# Patient Record
Sex: Female | Born: 1935 | Race: Black or African American | Hispanic: No
Health system: Southern US, Community
[De-identification: ages and names within clinical notes are randomized; demographics above are authoritative.]

## PROBLEM LIST (undated history)

## (undated) DIAGNOSIS — I2510 Atherosclerotic heart disease of native coronary artery without angina pectoris: Secondary | ICD-10-CM

## (undated) DIAGNOSIS — I10 Essential (primary) hypertension: Secondary | ICD-10-CM

## (undated) DIAGNOSIS — H409 Unspecified glaucoma: Secondary | ICD-10-CM

## (undated) DIAGNOSIS — N189 Chronic kidney disease, unspecified: Secondary | ICD-10-CM

## (undated) DIAGNOSIS — Z955 Presence of coronary angioplasty implant and graft: Secondary | ICD-10-CM

---

## 2016-07-01 ENCOUNTER — Emergency Department (HOSPITAL_COMMUNITY): Payer: Medicare PPO

## 2016-07-01 ENCOUNTER — Inpatient Hospital Stay (HOSPITAL_COMMUNITY)
Admission: EM | Admit: 2016-07-01 | Discharge: 2016-07-07 | DRG: 281 | Disposition: A | Discharge status: Skilled Nursing Facility | Payer: Medicare PPO | Attending: Infectious Diseases | Admitting: Infectious Diseases

## 2016-07-01 ENCOUNTER — Encounter (HOSPITAL_COMMUNITY): Payer: Self-pay | Admitting: Emergency Medicine

## 2016-07-01 DIAGNOSIS — Z87891 Personal history of nicotine dependence: Secondary | ICD-10-CM

## 2016-07-01 DIAGNOSIS — N183 Chronic kidney disease, stage 3 (moderate): Secondary | ICD-10-CM

## 2016-07-01 DIAGNOSIS — Z515 Encounter for palliative care: Secondary | ICD-10-CM

## 2016-07-01 DIAGNOSIS — Z79899 Other long term (current) drug therapy: Secondary | ICD-10-CM

## 2016-07-01 DIAGNOSIS — Z634 Disappearance and death of family member: Secondary | ICD-10-CM

## 2016-07-01 DIAGNOSIS — R627 Adult failure to thrive: Secondary | ICD-10-CM | POA: Diagnosis present

## 2016-07-01 DIAGNOSIS — R55 Syncope and collapse: Secondary | ICD-10-CM | POA: Diagnosis present

## 2016-07-01 DIAGNOSIS — Z87440 Personal history of urinary (tract) infections: Secondary | ICD-10-CM

## 2016-07-01 DIAGNOSIS — H409 Unspecified glaucoma: Secondary | ICD-10-CM | POA: Diagnosis present

## 2016-07-01 DIAGNOSIS — R64 Cachexia: Secondary | ICD-10-CM | POA: Diagnosis present

## 2016-07-01 DIAGNOSIS — M6281 Muscle weakness (generalized): Secondary | ICD-10-CM

## 2016-07-01 DIAGNOSIS — Z7902 Long term (current) use of antithrombotics/antiplatelets: Secondary | ICD-10-CM

## 2016-07-01 DIAGNOSIS — I5022 Chronic systolic (congestive) heart failure: Secondary | ICD-10-CM | POA: Diagnosis present

## 2016-07-01 DIAGNOSIS — I10 Essential (primary) hypertension: Secondary | ICD-10-CM

## 2016-07-01 DIAGNOSIS — I714 Abdominal aortic aneurysm, without rupture: Secondary | ICD-10-CM | POA: Diagnosis present

## 2016-07-01 DIAGNOSIS — I472 Ventricular tachycardia: Secondary | ICD-10-CM | POA: Diagnosis present

## 2016-07-01 DIAGNOSIS — I214 Non-ST elevation (NSTEMI) myocardial infarction: Secondary | ICD-10-CM

## 2016-07-01 DIAGNOSIS — E440 Moderate protein-calorie malnutrition: Secondary | ICD-10-CM | POA: Diagnosis present

## 2016-07-01 DIAGNOSIS — Z950 Presence of cardiac pacemaker: Secondary | ICD-10-CM

## 2016-07-01 DIAGNOSIS — E876 Hypokalemia: Secondary | ICD-10-CM | POA: Diagnosis present

## 2016-07-01 DIAGNOSIS — J811 Chronic pulmonary edema: Secondary | ICD-10-CM

## 2016-07-01 DIAGNOSIS — I129 Hypertensive chronic kidney disease with stage 1 through stage 4 chronic kidney disease, or unspecified chronic kidney disease: Secondary | ICD-10-CM

## 2016-07-01 DIAGNOSIS — I2510 Atherosclerotic heart disease of native coronary artery without angina pectoris: Secondary | ICD-10-CM | POA: Diagnosis present

## 2016-07-01 DIAGNOSIS — Z955 Presence of coronary angioplasty implant and graft: Secondary | ICD-10-CM

## 2016-07-01 DIAGNOSIS — I272 Pulmonary hypertension, unspecified: Secondary | ICD-10-CM | POA: Diagnosis present

## 2016-07-01 DIAGNOSIS — E872 Acidosis: Secondary | ICD-10-CM

## 2016-07-01 DIAGNOSIS — N390 Urinary tract infection, site not specified: Secondary | ICD-10-CM | POA: Diagnosis present

## 2016-07-01 DIAGNOSIS — I447 Left bundle-branch block, unspecified: Secondary | ICD-10-CM | POA: Diagnosis present

## 2016-07-01 DIAGNOSIS — N184 Chronic kidney disease, stage 4 (severe): Secondary | ICD-10-CM | POA: Diagnosis present

## 2016-07-01 DIAGNOSIS — R7989 Other specified abnormal findings of blood chemistry: Secondary | ICD-10-CM

## 2016-07-01 DIAGNOSIS — E861 Hypovolemia: Secondary | ICD-10-CM | POA: Diagnosis present

## 2016-07-01 DIAGNOSIS — E785 Hyperlipidemia, unspecified: Secondary | ICD-10-CM | POA: Diagnosis present

## 2016-07-01 DIAGNOSIS — N179 Acute kidney failure, unspecified: Secondary | ICD-10-CM | POA: Diagnosis not present

## 2016-07-01 DIAGNOSIS — I428 Other cardiomyopathies: Secondary | ICD-10-CM | POA: Diagnosis present

## 2016-07-01 DIAGNOSIS — Z8249 Family history of ischemic heart disease and other diseases of the circulatory system: Secondary | ICD-10-CM

## 2016-07-01 DIAGNOSIS — Z66 Do not resuscitate: Secondary | ICD-10-CM | POA: Diagnosis present

## 2016-07-01 DIAGNOSIS — Z972 Presence of dental prosthetic device (complete) (partial): Secondary | ICD-10-CM

## 2016-07-01 DIAGNOSIS — R778 Other specified abnormalities of plasma proteins: Secondary | ICD-10-CM

## 2016-07-01 DIAGNOSIS — I130 Hypertensive heart and chronic kidney disease with heart failure and stage 1 through stage 4 chronic kidney disease, or unspecified chronic kidney disease: Secondary | ICD-10-CM | POA: Diagnosis present

## 2016-07-01 DIAGNOSIS — F329 Major depressive disorder, single episode, unspecified: Secondary | ICD-10-CM | POA: Diagnosis present

## 2016-07-01 DIAGNOSIS — Z7189 Other specified counseling: Secondary | ICD-10-CM

## 2016-07-01 DIAGNOSIS — I959 Hypotension, unspecified: Secondary | ICD-10-CM | POA: Diagnosis present

## 2016-07-01 DIAGNOSIS — N189 Chronic kidney disease, unspecified: Secondary | ICD-10-CM

## 2016-07-01 DIAGNOSIS — R911 Solitary pulmonary nodule: Secondary | ICD-10-CM | POA: Diagnosis present

## 2016-07-01 HISTORY — DX: Chronic kidney disease, unspecified: N18.9

## 2016-07-01 HISTORY — DX: Presence of coronary angioplasty implant and graft: Z95.5

## 2016-07-01 HISTORY — DX: Atherosclerotic heart disease of native coronary artery without angina pectoris: I25.10

## 2016-07-01 HISTORY — DX: Unspecified glaucoma: H40.9

## 2016-07-01 HISTORY — DX: Essential (primary) hypertension: I10

## 2016-07-01 LAB — COMPREHENSIVE METABOLIC PANEL
ALBUMIN: 3.1 g/dL — AB (ref 3.5–5.0)
ALT: 21 U/L (ref 14–54)
ANION GAP: 14 (ref 5–15)
AST: 35 U/L (ref 15–41)
Alkaline Phosphatase: 67 U/L (ref 38–126)
BUN: 45 mg/dL — AB (ref 6–20)
CHLORIDE: 105 mmol/L (ref 101–111)
CO2: 20 mmol/L — AB (ref 22–32)
Calcium: 9.3 mg/dL (ref 8.9–10.3)
Creatinine, Ser: 1.77 mg/dL — ABNORMAL HIGH (ref 0.44–1.00)
GFR calc Af Amer: 29 mL/min — ABNORMAL LOW (ref >60)
GFR calc non Af Amer: 25 mL/min — ABNORMAL LOW (ref >60)
GLUCOSE: 112 mg/dL — AB (ref 65–99)
POTASSIUM: 3.4 mmol/L — AB (ref 3.5–5.1)
SODIUM: 139 mmol/L (ref 135–145)
Total Bilirubin: 0.5 mg/dL (ref 0.3–1.2)
Total Protein: 6.6 g/dL (ref 6.5–8.1)

## 2016-07-01 LAB — I-STAT TROPONIN, ED: Troponin i, poc: 0.06 ng/mL (ref 0.00–0.08)

## 2016-07-01 LAB — URINALYSIS, ROUTINE W REFLEX MICROSCOPIC
Bilirubin Urine: NEGATIVE
GLUCOSE, UA: NEGATIVE mg/dL
HGB URINE DIPSTICK: NEGATIVE
Ketones, ur: NEGATIVE mg/dL
LEUKOCYTES UA: NEGATIVE
Nitrite: NEGATIVE
PH: 5 (ref 5.0–8.0)
PROTEIN: NEGATIVE mg/dL
SPECIFIC GRAVITY, URINE: 1.02 (ref 1.005–1.030)

## 2016-07-01 LAB — CBC WITH DIFFERENTIAL/PLATELET
BASOS ABS: 0.1 10*3/uL (ref 0.0–0.1)
BASOS PCT: 1 %
EOS ABS: 0.1 10*3/uL (ref 0.0–0.7)
Eosinophils Relative: 1 %
HEMATOCRIT: 35.7 % — AB (ref 36.0–46.0)
Hemoglobin: 12 g/dL (ref 12.0–15.0)
Lymphocytes Relative: 27 %
Lymphs Abs: 2.3 10*3/uL (ref 0.7–4.0)
MCH: 30.3 pg (ref 26.0–34.0)
MCHC: 33.6 g/dL (ref 30.0–36.0)
MCV: 90.2 fL (ref 78.0–100.0)
MONO ABS: 0.6 10*3/uL (ref 0.1–1.0)
MONOS PCT: 7 %
NEUTROS ABS: 5.5 10*3/uL (ref 1.7–7.7)
NEUTROS PCT: 64 %
Platelets: 192 10*3/uL (ref 150–400)
RBC: 3.96 MIL/uL (ref 3.87–5.11)
RDW: 14.4 % (ref 11.5–15.5)
WBC: 8.6 10*3/uL (ref 4.0–10.5)

## 2016-07-01 LAB — MAGNESIUM: Magnesium: 1.5 mg/dL — ABNORMAL LOW (ref 1.7–2.4)

## 2016-07-01 LAB — TROPONIN I
TROPONIN I: 0.06 ng/mL — AB (ref <0.03)
TROPONIN I: 0.6 ng/mL — AB (ref <0.03)
TROPONIN I: 3.75 ng/mL — AB (ref <0.03)

## 2016-07-01 MED ORDER — ACETAMINOPHEN 650 MG RE SUPP: 650.0000 mg | Freq: Four times a day (QID) | RECTAL | Status: DC | PRN

## 2016-07-01 MED ORDER — PRAVASTATIN SODIUM 40 MG PO TABS
40.0000 mg | ORAL_TABLET | Freq: Every day | ORAL | Status: DC
  Administered 2016-07-02 – 2016-07-06 (×5): 40 mg via ORAL
  Filled 2016-07-01 (×6): qty 1

## 2016-07-01 MED ORDER — SODIUM CHLORIDE 0.9 % IV BOLUS (SEPSIS)
1000.0000 mL | Freq: Once | INTRAVENOUS | Status: AC
  Administered 2016-07-01: 1000 mL via INTRAVENOUS

## 2016-07-01 MED ORDER — SODIUM CHLORIDE 0.9 % IV BOLUS (SEPSIS)
500.0000 mL | Freq: Once | INTRAVENOUS | Status: AC
  Administered 2016-07-01: 500 mL via INTRAVENOUS

## 2016-07-01 MED ORDER — ENOXAPARIN SODIUM 30 MG/0.3ML ~~LOC~~ SOLN
30.0000 mg | SUBCUTANEOUS | Status: DC
  Administered 2016-07-01: 30 mg via SUBCUTANEOUS
  Filled 2016-07-01: qty 0.3

## 2016-07-01 MED ORDER — CLOPIDOGREL BISULFATE 75 MG PO TABS: 75.0000 mg | ORAL_TABLET | Freq: Every day | ORAL | Status: DC

## 2016-07-01 MED ORDER — ASPIRIN 325 MG PO TABS
325.0000 mg | ORAL_TABLET | Freq: Every day | ORAL | Status: DC
  Administered 2016-07-01 – 2016-07-02 (×2): 325 mg via ORAL
  Filled 2016-07-01 (×2): qty 1

## 2016-07-01 MED ORDER — POLYETHYLENE GLYCOL 3350 17 G PO PACK: 17.0000 g | PACK | Freq: Every day | ORAL | Status: DC | PRN

## 2016-07-01 MED ORDER — MAGNESIUM SULFATE 2 GM/50ML IV SOLN
2.0000 g | Freq: Once | INTRAVENOUS | Status: AC
  Administered 2016-07-01: 2 g via INTRAVENOUS
  Filled 2016-07-01: qty 50

## 2016-07-01 MED ORDER — SODIUM CHLORIDE 0.9 % IV SOLN
INTRAVENOUS | Status: AC
  Administered 2016-07-01 (×2): via INTRAVENOUS

## 2016-07-01 MED ORDER — ENSURE ENLIVE PO LIQD
237.0000 mL | Freq: Two times a day (BID) | ORAL | Status: DC
  Administered 2016-07-02 – 2016-07-07 (×9): 237 mL via ORAL

## 2016-07-01 MED ORDER — SODIUM CHLORIDE 0.9% FLUSH
3.0000 mL | Freq: Two times a day (BID) | INTRAVENOUS | Status: DC
  Administered 2016-07-01 – 2016-07-05 (×5): 3 mL via INTRAVENOUS

## 2016-07-01 MED ORDER — POTASSIUM CHLORIDE 20 MEQ/15ML (10%) PO SOLN
20.0000 meq | Freq: Once | ORAL | Status: AC
  Administered 2016-07-01: 20 meq via ORAL
  Filled 2016-07-01: qty 15

## 2016-07-01 MED ORDER — ACETAMINOPHEN 325 MG PO TABS
650.0000 mg | ORAL_TABLET | Freq: Four times a day (QID) | ORAL | Status: DC | PRN
  Administered 2016-07-06 – 2016-07-07 (×2): 650 mg via ORAL
  Filled 2016-07-01 (×2): qty 2

## 2016-07-01 MED ORDER — POTASSIUM CHLORIDE CRYS ER 20 MEQ PO TBCR
20.0000 meq | EXTENDED_RELEASE_TABLET | Freq: Once | ORAL | Status: DC
  Filled 2016-07-01: qty 1
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
## 2016-07-01 NOTE — ED Notes (Signed)
Patient transporting to radiology for xray with family member and transporter. Pt resting.

## 2016-07-01 NOTE — ED Notes (Signed)
Pt sleeping. Rise and fall of chest noted

## 2016-07-01 NOTE — ED Triage Notes (Signed)
Pt to ER BIB [facility] from home where patient lives with family. Pt here for evaluation of syncopal episode while using the restroom this morning. No fall, family lowered patient to the floor. EMS was activated. On arrival EMS reported hypotension with diaphoresis. On arrival, patient hypotension resolved at 113/59, HR 70, pt resting at this time. 20 G to Right wrist. Currently being treated for UTI per EMS.

## 2016-07-01 NOTE — Discharge Summary (Signed)
Name: Lindsay Rose MRN: [id number] DOB: [birth year] 82 y.o. PCP: No primary care provider on file.  Date of Admission: 07/01/2016  7:02 AM Date of Discharge: 07/07/2016 Attending Physician: Ginnie Smart, MD  Discharge Diagnosis: 1. NSEMI 2. HFrEF 3. Depression 4. Malnutrition  Principal Problem:   NSTEMI (non-ST elevated myocardial infarction) (HCC) Active Problems:   Syncope   Acute kidney injury (HCC)   Malnutrition of moderate degree   Essential hypertension, benign   Elevated troponin   Positive D dimer   Syncope and collapse   Pulmonary HTN   Muscle weakness (generalized)   Goals of care, counseling/discussion   Palliative care encounter   Discharge Medications:   Medication List    STOP taking these medications   levofloxacin 250 MG tablet Commonly known as:  LEVAQUIN     TAKE these medications   amLODipine 10 MG tablet Commonly known as:  NORVASC Take 10 mg by mouth daily.   aspirin 81 MG EC tablet Take 1 tablet (81 mg total) by mouth daily. Start taking on:  07/08/2016   carvedilol 3.125 MG tablet Commonly known as:  COREG Take 1 tablet (3.125 mg total) by mouth 2 (two) times daily with a meal.   citalopram 20 MG tablet Commonly known as:  CELEXA Take 1 tablet (20 mg total) by mouth daily. Start taking on:  07/08/2016   clopidogrel 75 MG tablet Commonly known as:  PLAVIX Take 75 mg by mouth daily.   COMBIGAN 0.2-0.5 % ophthalmic solution Generic drug:  brimonidine-timolol Place 1 drop into both eyes 2 (two) times daily.   feeding supplement (ENSURE ENLIVE) Liqd Take 237 mLs by mouth 2 (two) times daily between meals.   hydrochlorothiazide 25 MG tablet Commonly known as:  HYDRODIURIL Take 25 mg by mouth daily.   isosorbide mononitrate 30 MG 24 hr tablet Commonly known as:  IMDUR Take 0.5 tablets (15 mg total) by mouth daily. Start taking on:  07/08/2016   losartan 25 MG tablet Commonly known as:  COZAAR Take 1 tablet  (25 mg total) by mouth at bedtime.   metoprolol succinate 100 MG 24 hr tablet Commonly known as:  TOPROL-XL Take 100 mg by mouth daily. Take with or immediately following a meal.   mirtazapine 15 MG disintegrating tablet Commonly known as:  REMERON SOL-TAB Take 0.5 tablets (7.5 mg total) by mouth at bedtime.   pravastatin 40 MG tablet Commonly known as:  PRAVACHOL Take 40 mg by mouth daily.   promethazine 25 MG tablet Commonly known as:  PHENERGAN Take 25 mg by mouth every 6 (six) hours as needed for nausea or vomiting.   spironolactone 25 MG tablet Commonly known as:  ALDACTONE Take 0.5 tablets (12.5 mg total) by mouth daily. Start taking on:  07/08/2016   terazosin 1 MG capsule Commonly known as:  HYTRIN Take 1 mg by mouth at bedtime.       Disposition and follow-up:   Lindsay Rose was discharged from [facility] in [place] condition.  At the hospital follow up visit please address:  1.  CHF.  Assess volume status and diuretic regimen.  2.  HTN.  Manage antihypertensives  3.  Depression.  Depression is major contributor to her malnourishment and failure to thrive.  Started citalopram and mirtazapine while admitted.  4. Pulmonary Nodule.  Incidentally found on CT, RUL nodule, consider repeat CT in 1 year.  5.  AAA.  Incidentally found on CT, consider ultrasound in 3 years.  6.  Labs / imaging needed at time of follow-up: CBC, BMP  7.  Pending labs/ test needing follow-up: none  Follow-up Appointments: Follow-up Information    Robbie Lis, PA-C Follow up on 07/22/2016.   Specialties:  Cardiology, Radiology Why:  at 3:00pm for hospital follow up.  Contact information: [address] [place] [address] [phone]        [facility]. Go in 6 day(s).   Why:  at 3:15 Contact information: [address] [place] [address] [phone]       Primary Care. Schedule an  appointment as soon as possible for a visit in 2 week(s).        Cardiology. Schedule an appointment as soon as possible for a visit in 1 month(s).           Hospital Course by problem list: Principal Problem:   NSTEMI (non-ST elevated myocardial infarction) (HCC) Active Problems:   Syncope   Acute kidney injury (HCC)   Malnutrition of moderate degree   Essential hypertension, benign   Elevated troponin   Positive D dimer   Syncope and collapse   Pulmonary HTN   Muscle weakness (generalized)   Goals of care, counseling/discussion   Palliative care encounter   1. Syncope Lindsay Rose presented following 2 episodes of syncope associated reported to be associated with micturation, though she was unable to recall the circumstances surrounding her loss of consciousness.  She was orthostatic on presentation and was found to have elevated troponins.  To investigate possible etiologies of syncope her syncope, PE was ruled out with CTA, her pacemaker was interrogated and did not show any shocks or arrythmias to explain her loss of consciousness.  She was given IVF and her orthostasis improved.  She had no further episodes of syncope.  Ultimately, vasovagal syncope or orthostasis were deemed to be most likely cause of her syncope.  2. NSTEMI As part of the investigation of her syncope, ACS was probed with troponins and EKG.  She had no clear signs of ischemia on her V-paced EKG, but Troponins were elevated with peak of 4.14 the night of admission and began falling.  She never reported symptoms of chest pain or dyspnea.  She was therapeutically anticoagulated with heparin Cardiac catheterization performed on 11/14 showed 70% stenosis of RCA, was not intervened upon.  -Dual antiplatelet therapy with aspirin and plavix  3. HTN 4. CHF Echocardigram showed reduced EF of 20-25%. With unclear medication compliance prior to admission, she was started on carvedilol 3.125 mg BID, losartan 25 mg daily,  Imdur 15 mg daily, and spironolactone 12.5 mg daily.  At admission she was hypovolemic and orthostatic, and was gently fluid repleted with normal saline and became normotensive.  5. Depression Endorses profound depression leading to apathy, malnourishment, and the psychomotor retardation present at admission. Reports successful treatment with antidepressants in the past, and is interested in resuming treatment.  During her admission, she had days of profoundly depressed and apathetic mood and affect and at teams when her niece was visting she had normal mood and affect.  She was started on citalopram 20 mg daily and mirtazapine 7.5 mg at night.  6. Acute on Chronic Renal Insufficiency Elevated creatinine, BUN/Cr, positive orthostatic vitals and clinically volume depleted on admission consistent with prerenal azotemia. Endorses very poor PO intake due to depression and anorexia prior to admission. Returned to baseline with IV hydration and improved PO intake.  7. Concern for GI Bleed History  on admission noted one possible maroon-colored stool on the day of admission, but she seems to not be a reliable historian. No melenic stools or hematochezzia, FOBT negative, no clinical evidence of GI bleed.  Discharge Vitals:   BP (!) 143/75 (BP Location: Left Arm)   Pulse 81   Temp 98.2 F (36.8 C) (Oral)   Resp 18   Wt 97 lb (44 kg)   SpO2 100%   Pertinent Labs, Studies, and Procedures:   CBC Latest Ref Rng & Units 07/07/2016 07/06/2016 07/05/2016  WBC 4.0 - 10.5 K/uL 8.9 11.3(H) 11.8(H)  Hemoglobin 12.0 - 15.0 g/dL 1.6(X) 0.9(U) 10.3(L)  Hematocrit 36.0 - 46.0 % 25.8(L) 27.6(L) 31.3(L)  Platelets 150 - 400 K/uL 268 251 252   CMP Latest Ref Rng & Units 07/07/2016 07/06/2016 07/05/2016  Glucose 65 - 99 mg/dL 67 78 84  BUN 6 - 20 mg/dL 10 12 12   Creatinine 0.44 - 1.00 mg/dL 0.45(W) 0.98 1.19  Sodium 135 - 145 mmol/L 140 141 142  Potassium 3.5 - 5.1 mmol/L 3.5 3.6 3.6  Chloride 101 - 111  mmol/L 107 110 110  CO2 22 - 32 mmol/L 21(L) 23 24  Calcium 8.9 - 10.3 mg/dL 1.4(N) 8.2(N) 5.6(O)  Total Protein 6.5 - 8.1 g/dL - - -  Total Bilirubin 0.3 - 1.2 mg/dL - - -  Alkaline Phos 38 - 126 U/L - - -  AST 15 - 41 U/L - - -  ALT 14 - 54 U/L - - -   CTA Chest PE 07/05/2016 IMPRESSION: 1. Technically adequate exam showing no acute pulmonary embolus. 2. Cardiomegaly and coronary artery disease.  Transvenous pacemaker. 3. Emphysematous changes. 4. Right upper lobe nodule. No follow-up needed if patient is low-risk. Non-contrast chest CT can be considered in 12 months if patient is high-risk. This recommendation follows the consensus statement: Guidelines for Management of Incidental Pulmonary Nodules Detected on CT Images: From the Fleischner Society 2017; Radiology 2017; 284:228-243. 5. Abdominal aortic aneurysm, 3.3 cm maximum diameter. Recommend followup by [place] in 3 years. This recommendation follows ACR consensus guidelines: White Paper of the ACR Incidental Findings Committee II on Vascular Findings. J Am Coll Radiol 2013; 13:086-578 6. Colonic diverticulosis.  Echocardiogram 07/02/2016 EF 30-35% G1DD Pulmonary arteries: Systolic pressure was severely increased. PA peak pressure: 67 mm Hg (S). Inferior vena cava: The vessel was dilated. The respirophasic diameter changes were blunted (< 50%), consistent with elevated central venous pressure. Right ventricle: The cavity size was normal. Wall thickness was normal. Systolic function was normal.  Right and Left Heart Catheterization and Coronary Angiography 07/06/2016  LV end diastolic pressure is normal.  Mid RCA lesion, 70 %stenosed.  Ost LAD to Prox LAD lesion, 20 %stenosed.  Ost Cx to Prox Cx lesion, 30 %stenosed.  LV end diastolic pressure is normal.  Normal right heart pressures.   1. Single vessel borderline obstructive CAD 2. Normal LVEDP 3. Normal right heart pressures. 4. Normal cardiac output.   Plan:  medical management.   Discharge Instructions: Discharge Instructions    Diet - low sodium heart healthy    Complete by:  As directed    Increase activity slowly    Complete by:  As directed      You were admitted to the hospital because you passed out, and we found that you were having a heart attack.  We treated you for the heart attack and adjusted your heart medications.  You have also been suffering from severe depression which has  contributed to you becoming weak and malnourished.  I have prescribed you 2 new medicine for depression, citalopram and mitrazapine.  It is important to take them every day regardless of how you are feeling.  Please review your medication list and take everything as prescribed.  It is important for you to follow-up with a primary doctor next week and a cardiologist within a month.  We have made appointments for [place] in [place].  [facility], please establish care with a doctor ASAP.  Signed: Alm BustardMatthew O'Sullivan, MD 07/07/2016, 1:35 PM   Pager: [phone]

## 2016-07-01 NOTE — ED Notes (Signed)
Attempted to call report to 2W 

## 2016-07-01 NOTE — ED Notes (Signed)
Pt medical records from [facility] obtained and given to MD Liu.

## 2016-07-01 NOTE — ED Notes (Signed)
Secretary Cassie asked to obtain medical records from patient hospital visit last week.

## 2016-07-01 NOTE — ED Notes (Addendum)
CRITICAL VALUE ALERT  Critical value received:  Troponin 0.06  Date of notification:  07/01/2016  Time of notification:  0924  Critical value read back: yes  Nurse who received alert:  Fredirick Maudlin  MD notified: MD Verdie Mosher
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
## 2016-07-01 NOTE — Consult Note (Signed)
Cardiology Consult    Patient ID: Lindsay Rose MRN: [id number], DOB/AGE: May 17, 1928   Admit date: 07/01/2016 Date of Consult: 07/01/2016  Primary Physician: No primary care provider on file. Reason for Consult: Troponin Elevation Primary Cardiologist: None Requesting Provider: Dr. Georgetta Haber   History of Present Illness    [name] is an 82 year old female with a significant past medical history of stage III chronic kidney disease, essential hypertension, and pacemaker for unclear etiology at this time is here for syncope and concern for bloody stools.  [name] was admitted earlier today after fainting twice at home.  Patient states that she did faint at home today but is unable to tell me what exactly happened.  She states she does not remember her symptoms before or after fainting, but does state she fainted.  Patient states that she has been doing well until today.  At this time, she does not report chest pain or shortness of breath.  Patient says that this is the first time she ever fainted.  She denies ever feeling lightheaded or dizzy.  Patient is without any other cardiac related complaints such as swelling in her legs, shortness of breath when laying flat, or waking up in the middle of the night due to shortness of breath.  She does not recall why she has a defibrillator but states it was placed because her heart was weak.  She does not report having a heart attack prior.    Past Medical History   Past Medical History:  Diagnosis Date  . CKD (chronic kidney disease) 07/01/2016  . Essential hypertension 07/01/2016  . Glaucoma 07/01/2016  . Presence of stent in coronary artery in patient with coronary artery disease 07/01/2016    History reviewed. No pertinent surgical history.   Allergies  Not on File  Inpatient Medications    . aspirin  325 mg Oral Daily  . enoxaparin (LOVENOX) injection  30 mg Subcutaneous Q24H  . [START ON 07/02/2016]  feeding supplement (ENSURE ENLIVE)  237 mL Oral BID BM  . pravastatin  40 mg Oral q1800  . sodium chloride flush  3 mL Intravenous Q12H    Family History    History reviewed. No pertinent family history.  Social History    Social History   Social History  . Marital status: Widowed    Spouse name: N/A  . Number of children: N/A  . Years of education: N/A   Occupational History  . Not on file.   Social History Main Topics  . Smoking status: Unknown If Ever Smoked  . Smokeless tobacco: Never Used  . Alcohol use No  . Drug use: Unknown  . Sexual activity: Not on file   Other Topics Concern  . Not on file   Social History Narrative  . No narrative on file     Review of Systems    General:  No chills, fever, night sweats or weight changes.  Cardiovascular:  No chest pain, dyspnea on exertion, edema, orthopnea, palpitations, paroxysmal nocturnal dyspnea. Dermatological: No rash, lesions/masses Respiratory: No cough, dyspnea Urologic: No hematuria, dysuria Abdominal:   No nausea, vomiting, diarrhea, bright red blood per rectum, melena, or hematemesis Neurologic:  No visual changes, wkns, changes in mental status. All other systems reviewed and are otherwise negative except as noted above.  Physical Exam    Blood pressure 136/75, pulse 64, temperature 97.9 F (36.6 C), temperature source Oral, resp. rate 20, SpO2 100 %.  General: Pleasant,  NAD Psych: Normal affect. Neuro: Alert and oriented X 3. Moves all extremities spontaneously. HEENT: Normal  Neck: Supple without bruits or JVD. Lungs:  Resp regular and unlabored, CTA. Heart: RRR, frequent ectopy, 1/6 systolic ejection murmur, no s3, s4. Abdomen: Soft, non-tender, non-distended, BS + x 4.  Extremities: No clubbing, cyanosis or edema. DP/PT/Radials 2+ and equal bilaterally.  Labs    Troponin [facility] Test)  Recent Labs  07/01/16 0742  TROPIPOC 0.06    Recent Labs  07/01/16 0735 07/01/16 1309  07/01/16 1914  TROPONINI 0.06* 0.60* 3.75*   Lab Results  Component Value Date   WBC 8.6 07/01/2016   HGB 12.0 07/01/2016   HCT 35.7 (L) 07/01/2016   MCV 90.2 07/01/2016   PLT 192 07/01/2016    Recent Labs Lab 07/01/16 0735  NA 139  K 3.4*  CL 105  CO2 20*  BUN 45*  CREATININE 1.77*  CALCIUM 9.3  PROT 6.6  BILITOT 0.5  ALKPHOS 67  ALT 21  AST 35  GLUCOSE 112*   No results found for: CHOL, HDL, LDLCALC, TRIG No results found for: [facility]   Radiology Studies    Dg Chest 2 View  Result Date: 07/01/2016 CLINICAL DATA:  Generalized weakness for the past 2 weeks, syncope. EXAM: CHEST  2 VIEW COMPARISON:  None in PACs FINDINGS: The lungs are mildly hyperinflated. There is bibasilar density. There is no large pleural effusion. There is no pneumothorax. The heart is mildly enlarged. There is calcification of the mitral annulus. The pulmonary vascularity is normal. The ICD is in reasonable position. There is calcification in the wall of the aortic arch. The observed bony thorax exhibits no acute abnormality. IMPRESSION: COPD. Bibasilar atelectasis or scarring. Cardiomegaly without pulmonary edema. Aortic atherosclerosis. Electronically Signed   By: David  [place] M.D.   On: 07/01/2016 08:04    EKG & Cardiac Imaging    EKG: 07/01/16 at 21:08; normal sinus rhythm with intermittent atrial pacing, ventricular pacing, non-specific t-wave changes  Echocardiogram: none documented  Assessment & Plan    [name] is an 82 year old female with a significant past medical history of stage III chronic kidney disease, essential hypertension, and pacemaker for unclear etiology at this time is here for syncope and concern for bloody stools.  Patient has a rising troponin level concerning for NSTEMI-ACS.  Patient with vague symptoms and syncope that may be due to NSTEMI-ACS, but potential gastrointestinal could be an etiology with her troponin elevation due to a type II myocardial  infarction (demand ischemia).  Low concern for non-coronary related etiologies of her elevated troponin level such as pulmonary embolism or myocarditis.  # NSTEMI-ACS: Type I (acute plaque rupture) versus a type II myocardial infarction in the setting of potential gastrointestinal bleeding. At this time, patient is hemodynamically stable.  Ventricular pacing decreasing the sensitivity of evaluating for myocardial ischemia/infarction, but no obvious concerning changes on ECG at this time.  Her GRACE Score is 169, making her intermediate risk for in-hospital death.  Management of NSTEM-ACS is complicated at this time due to potential gastrointestinal bleeding. - Please confirm patient has blood in her stool.  If she does not, would recommend starting heparin drip for 48 hours. - Will await starting a P2Y12 (i.e. Plavix) at this time while we await stool results. - Please trend troponin level for now x2. - Due to blood pressure being on the lower end of normal and potential for hypovolemic shock, will recommend against starting a  beta-blocker at this time.   - Will need to get echocardiogram in the morning. - Will pursue an ischemia guided strategy at this time and will not recommend a left heart catheterization based on her DNR status, medical co-morbidities, and potential active bleeding.   - Continue aspirin at 81 mg/daily for now.   - Check lipid panel in the morning.    # Syncope  Unclear etiology at this time in the setting of potential gastrointestinal bleeding, dual chamber defibrillator, and NSTEMI-ACS.  Patient appears to be hemodynamically stable at this time.  Currently awaiting device check for further evaluation of potential etiologies for syncope.   - Day team cardiologist will follow up with device check results. - Continue telemetry and management of NSTEMI-ACS as above.   # Essential hypertension: Patient with a chart history of essential hypertension.  It appears that she does have  overt end-organ damage with stage III chronic kidney disease.  Home medications are being held at this time due to syncope and potential hypovolemia. - Agree with holding blood pressure medications at this time.     Signed, Judie GrieveKamal D Alvenia Treese, MD 07/01/2016, 10:08 PM

## 2016-07-01 NOTE — H&P (Signed)
  Date: 07/01/2016  Patient name: Lindsay Rose  Medical record number: [id number]  Date of birth: [birth year]   I have seen and evaluated Lindsay Rose and discussed their care with the Residency Team.  82 yo F with hx of recent treatment for UTI (cefdinir --> levaquin).  She lives with family for last week (prior living with boyfriend) and also has a hx of pacemaker placement for ? reason.  She woke up this AM and was noted to be diaphoretic by her niece. She was helped to the toilet and synopsized. Her BM at that time was noted as initially formed then progressing to runny/burgundy/tarry.  EMS was called and she again had a syncopal episode.   PMHx, Fam Hx, and/or Soc Hx : see H/O's note.  ROS: no CP, SOB, occas cough, no dysuria, no palpitations, no headaches, no room spinning or dizziness, no vision change. Denies falls.   Vitals:   07/01/16 1055 07/01/16 1236  BP: 128/69 128/60  Pulse: 83 73  Resp: 20 19  Temp:  97.7 F (36.5 C)  afebrile Eyes- EOMI Mouth-dry Chest- CTA. L chest pacer is non-tender, no fluctuance.  CV- RRR Abd- BS+, soft, non-tender.  Extr- no edema, no LE lesions.  MSE- Date xx-2017, Place- [place], [place].   President (correct)  Lab: K 3.4 Cr 1.77 Bun 45 Troponin 0.06 -- 0.06 --0.6 CXR: COPD. Bibasilar atelectasis or scarring. Cardiomegaly without pulmonary edema. Aortic atherosclerosis. ECG: ventricular paced rhythm 81 bpm.   Assessment and Plan: I have seen and evaluated the patient as outlined above. I agree with the formulated Assessment and Plan as detailed in the residents' note, with the following changes:   Syncope Will check TTE will f/u her Tele Her Troponin has increased.will have her seen by CV Hydration as she tolerates.  Not clear if med related (beta-blocker) Check stool guaic.   Pacer Will ask CV to interrogate.  The etiology of this is unclear.   ARF? Her baseline is not clear. Cr 1.8 one week prior  Failure to  Thrive Suspect due to multiple psychosocial issues at previous living environment.  Will have nutrition eval.   Ginnie Smart, MD 11/9/20171:50 PM

## 2016-07-02 ENCOUNTER — Observation Stay (HOSPITAL_COMMUNITY): Payer: Medicare PPO

## 2016-07-02 ENCOUNTER — Observation Stay (HOSPITAL_BASED_OUTPATIENT_CLINIC_OR_DEPARTMENT_OTHER): Payer: Medicare PPO

## 2016-07-02 DIAGNOSIS — N184 Chronic kidney disease, stage 4 (severe): Secondary | ICD-10-CM | POA: Diagnosis present

## 2016-07-02 DIAGNOSIS — R748 Abnormal levels of other serum enzymes: Secondary | ICD-10-CM

## 2016-07-02 DIAGNOSIS — N179 Acute kidney failure, unspecified: Secondary | ICD-10-CM | POA: Diagnosis present

## 2016-07-02 DIAGNOSIS — E43 Unspecified severe protein-calorie malnutrition: Secondary | ICD-10-CM

## 2016-07-02 DIAGNOSIS — I272 Pulmonary hypertension, unspecified: Secondary | ICD-10-CM | POA: Diagnosis present

## 2016-07-02 DIAGNOSIS — Z95810 Presence of automatic (implantable) cardiac defibrillator: Secondary | ICD-10-CM | POA: Diagnosis not present

## 2016-07-02 DIAGNOSIS — I428 Other cardiomyopathies: Secondary | ICD-10-CM | POA: Diagnosis present

## 2016-07-02 DIAGNOSIS — I2510 Atherosclerotic heart disease of native coronary artery without angina pectoris: Secondary | ICD-10-CM | POA: Diagnosis not present

## 2016-07-02 DIAGNOSIS — E440 Moderate protein-calorie malnutrition: Secondary | ICD-10-CM | POA: Insufficient documentation

## 2016-07-02 DIAGNOSIS — I5022 Chronic systolic (congestive) heart failure: Secondary | ICD-10-CM | POA: Diagnosis present

## 2016-07-02 DIAGNOSIS — F329 Major depressive disorder, single episode, unspecified: Secondary | ICD-10-CM | POA: Diagnosis present

## 2016-07-02 DIAGNOSIS — R55 Syncope and collapse: Secondary | ICD-10-CM

## 2016-07-02 DIAGNOSIS — I472 Ventricular tachycardia: Secondary | ICD-10-CM | POA: Diagnosis present

## 2016-07-02 DIAGNOSIS — I25118 Atherosclerotic heart disease of native coronary artery with other forms of angina pectoris: Secondary | ICD-10-CM | POA: Diagnosis not present

## 2016-07-02 DIAGNOSIS — I10 Essential (primary) hypertension: Secondary | ICD-10-CM

## 2016-07-02 DIAGNOSIS — R911 Solitary pulmonary nodule: Secondary | ICD-10-CM | POA: Diagnosis present

## 2016-07-02 DIAGNOSIS — R64 Cachexia: Secondary | ICD-10-CM | POA: Diagnosis present

## 2016-07-02 DIAGNOSIS — M6281 Muscle weakness (generalized): Secondary | ICD-10-CM | POA: Diagnosis not present

## 2016-07-02 DIAGNOSIS — I130 Hypertensive heart and chronic kidney disease with heart failure and stage 1 through stage 4 chronic kidney disease, or unspecified chronic kidney disease: Secondary | ICD-10-CM | POA: Diagnosis present

## 2016-07-02 DIAGNOSIS — Z7189 Other specified counseling: Secondary | ICD-10-CM | POA: Diagnosis not present

## 2016-07-02 DIAGNOSIS — E861 Hypovolemia: Secondary | ICD-10-CM | POA: Diagnosis present

## 2016-07-02 DIAGNOSIS — E785 Hyperlipidemia, unspecified: Secondary | ICD-10-CM | POA: Diagnosis present

## 2016-07-02 DIAGNOSIS — E872 Acidosis: Secondary | ICD-10-CM | POA: Diagnosis present

## 2016-07-02 DIAGNOSIS — I129 Hypertensive chronic kidney disease with stage 1 through stage 4 chronic kidney disease, or unspecified chronic kidney disease: Secondary | ICD-10-CM | POA: Diagnosis not present

## 2016-07-02 DIAGNOSIS — I959 Hypotension, unspecified: Secondary | ICD-10-CM | POA: Diagnosis present

## 2016-07-02 DIAGNOSIS — R627 Adult failure to thrive: Secondary | ICD-10-CM | POA: Diagnosis present

## 2016-07-02 DIAGNOSIS — E876 Hypokalemia: Secondary | ICD-10-CM | POA: Diagnosis present

## 2016-07-02 DIAGNOSIS — N390 Urinary tract infection, site not specified: Secondary | ICD-10-CM | POA: Diagnosis present

## 2016-07-02 DIAGNOSIS — I214 Non-ST elevation (NSTEMI) myocardial infarction: Secondary | ICD-10-CM | POA: Insufficient documentation

## 2016-07-02 DIAGNOSIS — I447 Left bundle-branch block, unspecified: Secondary | ICD-10-CM | POA: Diagnosis present

## 2016-07-02 DIAGNOSIS — H409 Unspecified glaucoma: Secondary | ICD-10-CM | POA: Diagnosis present

## 2016-07-02 DIAGNOSIS — N183 Chronic kidney disease, stage 3 (moderate): Secondary | ICD-10-CM | POA: Diagnosis not present

## 2016-07-02 DIAGNOSIS — Z66 Do not resuscitate: Secondary | ICD-10-CM | POA: Diagnosis present

## 2016-07-02 LAB — ECHOCARDIOGRAM COMPLETE
CHL CUP MV DEC (S): 275
CHL CUP RV SYS PRESS: 64 mmHg
CHL CUP TV REG PEAK VELOCITY: 385 cm/s
E/e' ratio: 10.97
EWDT: 275 ms
FS: 17 % — AB (ref 28–44)
IVS/LV PW RATIO, ED: 0.96
LA ID, A-P, ES: 30 mm
LAVOLA4C: 27.8 mL
LEFT ATRIUM END SYS DIAM: 30 mm
LV TDI E'MEDIAL: 3.71
LVEEAVG: 10.97
LVEEMED: 10.97
LVELAT: 5.18 cm/s
LVOT area: 3.8 cm2
LVOT diameter: 22 mm
Lateral S' vel: 10.8 cm/s
MV pk E vel: 56.8 m/s
MVPKAVEL: 111 m/s
PW: 9.89 mm — AB (ref 0.6–1.1)
TAPSE: 26.1 mm
TDI e' lateral: 5.18
TR max vel: 385 cm/s

## 2016-07-02 LAB — CBC
HEMATOCRIT: 31.1 % — AB (ref 36.0–46.0)
Hemoglobin: 10.2 g/dL — ABNORMAL LOW (ref 12.0–15.0)
MCH: 29.4 pg (ref 26.0–34.0)
MCHC: 32.8 g/dL (ref 30.0–36.0)
MCV: 89.6 fL (ref 78.0–100.0)
PLATELETS: 200 10*3/uL (ref 150–400)
RBC: 3.47 MIL/uL — ABNORMAL LOW (ref 3.87–5.11)
RDW: 14.4 % (ref 11.5–15.5)
WBC: 8.6 10*3/uL (ref 4.0–10.5)

## 2016-07-02 LAB — OCCULT BLOOD X 1 CARD TO LAB, STOOL: Fecal Occult Bld: NEGATIVE

## 2016-07-02 LAB — BASIC METABOLIC PANEL
Anion gap: 11 (ref 5–15)
BUN: 33 mg/dL — AB (ref 6–20)
CHLORIDE: 109 mmol/L (ref 101–111)
CO2: 19 mmol/L — ABNORMAL LOW (ref 22–32)
CREATININE: 1.27 mg/dL — AB (ref 0.44–1.00)
Calcium: 8.6 mg/dL — ABNORMAL LOW (ref 8.9–10.3)
GFR calc Af Amer: 43 mL/min — ABNORMAL LOW (ref >60)
GFR calc non Af Amer: 37 mL/min — ABNORMAL LOW (ref >60)
GLUCOSE: 67 mg/dL (ref 65–99)
Potassium: 3.5 mmol/L (ref 3.5–5.1)
SODIUM: 139 mmol/L (ref 135–145)

## 2016-07-02 LAB — LIPID PANEL
CHOL/HDL RATIO: 7.6 ratio
Cholesterol: 130 mg/dL (ref 0–200)
HDL: 17 mg/dL — AB (ref >40)
LDL CALC: 81 mg/dL (ref 0–99)
Triglycerides: 159 mg/dL — ABNORMAL HIGH (ref <150)
VLDL: 32 mg/dL (ref 0–40)

## 2016-07-02 LAB — TROPONIN I
TROPONIN I: 2.9 ng/mL — AB (ref <0.03)
Troponin I: 4.14 ng/mL (ref <0.03)

## 2016-07-02 LAB — HEPARIN LEVEL (UNFRACTIONATED): HEPARIN UNFRACTIONATED: 0.32 [IU]/mL (ref 0.30–0.70)

## 2016-07-02 MED ORDER — CLOPIDOGREL BISULFATE 75 MG PO TABS
75.0000 mg | ORAL_TABLET | Freq: Every day | ORAL | Status: DC
  Administered 2016-07-03 – 2016-07-07 (×5): 75 mg via ORAL
  Filled 2016-07-02 (×5): qty 1

## 2016-07-02 MED ORDER — CLOPIDOGREL BISULFATE 75 MG PO TABS
300.0000 mg | ORAL_TABLET | Freq: Once | ORAL | Status: DC
  Filled 2016-07-02: qty 4

## 2016-07-02 MED ORDER — ASPIRIN EC 81 MG PO TBEC
81.0000 mg | DELAYED_RELEASE_TABLET | Freq: Every day | ORAL | Status: DC
  Administered 2016-07-03 – 2016-07-07 (×5): 81 mg via ORAL
  Filled 2016-07-02 (×5): qty 1

## 2016-07-02 MED ORDER — WHITE PETROLATUM GEL
Status: AC
  Filled 2016-07-02: qty 1

## 2016-07-02 MED ORDER — HEPARIN (PORCINE) IN NACL 100-0.45 UNIT/ML-% IJ SOLN
550.0000 [IU]/h | INTRAMUSCULAR | Status: DC
  Administered 2016-07-02 – 2016-07-04 (×2): 550 [IU]/h via INTRAVENOUS
  Filled 2016-07-02 (×2): qty 250

## 2016-07-02 NOTE — Evaluation (Signed)
Physical Therapy Evaluation Patient Details Name: Lindsay BrunsJosephine Rose MRN: [id number] DOB: 01-12-1928 Today's Date: 07/02/2016   History of Present Illness  Patient is an 82 yo female admitted 07/01/16 following 2 syncopal episodes, with NSTEMI and FTT.  Patient with elevated troponin, now trending downward.    PMH:  pacemaker, CAD, stents, CKD, HTN, FTT, GIB  Clinical Impression  Patient presents with problems listed below.  Will benefit from acute PT to maximize functional mobility prior to discharge.  Patient with significant deconditioning, decreased balance/strength impacting functional mobility and safety.  Patient with decreased cognition/memory.  At this point, would recommend SNF at d/c to allow patient to reach Mod I to supervision level with mobility and gait.   Contacted niece Elpidio AnisLula Gregory.  Stated she was on her way to hospital and could questions wait.  Waited 1.5 hours.  Will get more clear information from Ms Earl LitesGregory at later time regarding home situation and PLOF.    Follow Up Recommendations SNF;Supervision/Assistance - 24 hour    Equipment Recommendations  Wheelchair (measurements PT);Wheelchair cushion (measurements PT)    Recommendations for Other Services       Precautions / Restrictions Precautions Precautions: Fall Restrictions Weight Bearing Restrictions: No      Mobility  Bed Mobility Overal bed mobility: Needs Assistance Bed Mobility: Supine to Sit;Sit to Supine     Supine to sit: Min assist;HOB elevated Sit to supine: Min assist;HOB elevated   General bed mobility comments: Assist to bring trunk to sitting position and to scoot hips to EOB in sitting.  Patient required increased time to complete transition.  Able to maintain balance in sitting with min guard assist.  Required min assist to bring LE's onto bed to return to supine.  Transfers Overall transfer level: Needs assistance Equipment used: 1 person hand held assist Transfers: Sit to/from  UG[employer] CorporationStand;Stand Pivot Transfers Sit to Stand: Min assist Stand pivot transfers: Min assist;Mod assist       General transfer comment: Required assist to move to standing and for balance.  Patient required min assist to take several shuffle steps to pivot to [facility].  On transfer back to bed, patient became fatigued and had difficulty following directions.  Patient sat abruptly onto bed with decreased control due to LE weakness.  Ambulation/Gait             General Gait Details: NT  Stairs            Wheelchair Mobility    Modified Rankin (Stroke Patients Only)       Balance     Sitting balance-Leahy Scale: Fair       Standing balance-Leahy Scale: Poor                               Pertinent Vitals/Pain Pain Assessment: No/denies pain    Home Living Family/patient expects to be discharged to:: Private residence Living Arrangements: Other relatives Psychiatrist(Neice) Available Help at Discharge: Family Type of Home: House Home Access: Stairs to enter       Home Equipment: Environmental consultantWalker - 2 wheels (per chart) Additional Comments: pt states that she did not have any equipment initially, the later stated that she has a RW    Prior Function Level of Independence: Independent with assistive device(s)   Gait / Transfers Assistance Needed: pt states that she uses a walker  ADL's / Homemaking Assistance Needed: pt states that she was independent with ADLs  Hand Dominance   Dominant Hand: Right    Extremity/Trunk Assessment   Upper Extremity Assessment: Generalized weakness           Lower Extremity Assessment: Generalized weakness      Cervical / Trunk Assessment: Kyphotic  Communication   Communication: No difficulties  Cognition Arousal/Alertness: Awake/alert Behavior During Therapy: Flat affect;Anxious Overall Cognitive Status: No family/caregiver present to determine baseline cognitive functioning Area of Impairment: Memory     Memory:  Decreased short-term memory         General Comments: Patient having difficulty providing information regarding home situation and PLOF.      General Comments      Exercises     Assessment/Plan    PT Assessment Patient needs continued PT services  PT Problem List Decreased strength;Decreased activity tolerance;Decreased mobility;Decreased balance;Decreased cognition;Decreased knowledge of use of DME;Cardiopulmonary status limiting activity          PT Treatment Interventions DME instruction;Gait training;Functional mobility training;Therapeutic activities;Therapeutic exercise;Balance training;Cognitive remediation;Patient/family education    PT Goals (Current goals can be found in the Care Plan section)  Acute Rehab PT Goals Patient Stated Goal: feel better and go home PT Goal Formulation: With patient Time For Goal Achievement: 07/16/16 Potential to Achieve Goals: Fair    Frequency Min 3X/week   Barriers to discharge   Unsure    Co-evaluation               End of Session Equipment Utilized During Treatment: Gait belt Activity Tolerance: Patient limited by fatigue Patient left: in bed;with call bell/phone within reach;with bed alarm set Nurse Communication: Mobility status (Patient voided in hat in [facility])    Functional Assessment Tool Used: Clinical judgement Functional Limitation: Mobility: Walking and moving around Mobility: Walking and Moving Around Current Status (A4536): At least 40 percent but less than 60 percent impaired, limited or restricted Mobility: Walking and Moving Around Goal Status [phone]): At least 1 percent but less than 20 percent impaired, limited or restricted    Time: 2122-4825 PT Time Calculation (min) (ACUTE ONLY): 15 min   Charges:   PT Evaluation $PT Eval Moderate Complexity: 1 Procedure     PT G Codes:   PT G-Codes **NOT FOR INPATIENT CLASS** Functional Assessment Tool Used: Clinical judgement Functional Limitation:  Mobility: Walking and moving around Mobility: Walking and Moving Around Current Status (O0370): At least 40 percent but less than 60 percent impaired, limited or restricted Mobility: Walking and Moving Around Goal Status [phone]): At least 1 percent but less than 20 percent impaired, limited or restricted    Vena Austria 07/02/2016, 7:34 PM Durenda Hurt. Renaldo Fiddler, [facility] Acute Rehab Services Pager [phone]

## 2016-07-02 NOTE — Evaluation (Signed)
Occupational Therapy Evaluation Patient Details Name: Aleesha Getachew MRN: [id number] DOB: 15-Apr-1928 Today's Date: 07/02/2016    History of Present Illness 82 year old woman who presents after syncope   Clinical Impression   Pt with decline in function and safety with ADLs and ADL mobility with decreased strength, balance and endurance. Pt would benefit from acute OT services to address impairments to increase level of function    Follow Up Recommendations  Home health OT    Equipment Recommendations  Tub/shower seat    Recommendations for Other Services       Precautions / Restrictions Precautions Precautions: Fall Restrictions Weight Bearing Restrictions: No      Mobility Bed Mobility Overal bed mobility: Needs Assistance Bed Mobility: Supine to Sit;Sit to Supine     Supine to sit: Min guard;HOB elevated (used rails) Sit to supine: Min assist   General bed mobility comments: min A with LEs back onto bed  Transfers Overall transfer level: Needs assistance Equipment used: None;1 person hand held assist Transfers: Sit to/from Stand Sit to Stand: Min assist;Min guard              Balance Overall balance assessment: Needs assistance   Sitting balance-Leahy Scale: Fair       Standing balance-Leahy Scale: Poor                              ADL Overall ADL's : Needs assistance/impaired   Eating/Feeding Details (indicate cue type and reason): pt able to feed self, however has Poor appetite Grooming: Wash/dry hands;Wash/dry face;Sitting;Min guard   Upper Body Bathing: Min guard;Sitting   Lower Body Bathing: Moderate assistance   Upper Body Dressing : Min guard   Lower Body Dressing: Moderate assistance   Toilet Transfer: Minimal assistance;BSC;Stand-pivot   Toileting- Clothing Manipulation and Hygiene: Minimal assistance;Sit to/from stand   Tub/ Shower Transfer: 3 in 1;Stand-pivot   Functional mobility during ADLs: Min  guard;Minimal assistance General ADL Comments: pt very weak and debilitated. Pt reports that she is so weak that she "can't do anything now"     Vision Vision Assessment?: No apparent visual deficits              Pertinent Vitals/Pain Pain Assessment: No/denies pain     Hand Dominance Right   Extremity/Trunk Assessment Upper Extremity Assessment Upper Extremity Assessment: Generalized weakness   Lower Extremity Assessment Lower Extremity Assessment: Defer to PT evaluation       Communication Communication Communication: No difficulties   Cognition Arousal/Alertness: Awake/alert Behavior During Therapy: WFL for tasks assessed/performed Overall Cognitive Status: Within Functional Limits for tasks assessed                     General Comments   Pt pleasant and cooperative                 Home Living Family/patient expects to be discharged to:: Private residence Living Arrangements: Other relatives Available Help at Discharge: Family Type of Home: House       Home Layout: One level     Bathroom Shower/Tub: Tub/shower unit;Walk-in shower   Bathroom Toilet: Standard     Home Equipment: Walker - 2 wheels (?)   Additional Comments: pt states that she did not have any equipment initially, the later stated that she has a RW      Prior Functioning/Environment Level of Independence: Independent with assistive device(s)  Gait / Transfers Assistance Needed: pt  states that she uses a walker ADL's / Homemaking Assistance Needed: pt states that she was independent with ADLs            OT Problem List: Decreased strength;Impaired balance (sitting and/or standing);Decreased activity tolerance;Decreased knowledge of use of DME or AE   OT Treatment/Interventions: Self-care/ADL training;DME and/or AE instruction;Therapeutic activities;Patient/family education    OT Goals(Current goals can be found in the care plan section) Acute Rehab OT Goals Patient  Stated Goal: feel better and go home OT Goal Formulation: With patient Time For Goal Achievement: 07/09/16 Potential to Achieve Goals: Good ADL Goals Pt Will Perform Grooming: with supervision;with set-up;sitting;standing Pt Will Perform Upper Body Bathing: with set-up;with supervision;standing;sitting Pt Will Perform Lower Body Bathing: with min assist;with min guard assist;sit to/from stand Pt Will Perform Upper Body Dressing: with set-up;with supervision;sitting;standing Pt Will Perform Lower Body Dressing: with min assist;with min guard assist;sitting/lateral leans;sit to/from stand Pt Will Transfer to Toilet: with min guard assist;with supervision;regular height toilet;bedside commode Pt Will Perform Toileting - Clothing Manipulation and hygiene: with min guard assist;sit to/from stand Pt Will Perform Tub/Shower Transfer: with min guard assist;with supervision;shower seat;3 in 1  OT Frequency: Min 2X/week   Barriers to D/C:    no barriers                     End of Session Equipment Utilized During Treatment: Other (comment);Gait belt (BSC)  Activity Tolerance: Patient limited by fatigue Patient left: in bed;with call bell/phone within reach;with bed alarm set   Time: 1255-1320 OT Time Calculation (min): 25 min Charges:  OT General Charges $OT Visit: 1 Procedure OT Evaluation $OT Eval Moderate Complexity: 1 Procedure OT Treatments $Therapeutic Activity: 8-22 mins G-Codes: OT G-codes **NOT FOR INPATIENT CLASS** Functional Assessment Tool Used: clinical judgement Functional Limitation: Self care Self Care Current Status (Z6109(G8987): At least 20 percent but less than 40 percent impaired, limited or restricted Self Care Goal Status (U0454(G8988): At least 1 percent but less than 20 percent impaired, limited or restricted  Galen ManilaSpencer, Airyana Sprunger Jeanette 07/02/2016, 2:21 PM

## 2016-07-02 NOTE — Progress Notes (Signed)
  Date: 07/02/2016  Patient name: Lindsay Rose  Medical record number: [id number]  Date of birth: 10/11/1927   This patient's plan of care was discussed with the house staff. Please see their note for complete details. I concur with their findings. Denies SOB, CP, dizziness.   Blood pressure (!) 146/57, pulse 72, temperature 97.9 F (36.6 C), temperature source Oral, resp. rate 19, weight 44 kg (97 lb), SpO2 99 %. CV- RRR Chest- CTA Abd- BS+, soft, nontender.   Labs Troponin 0.6 --> 3.75 --> 4.14 --> 2.90  NSTEMI Syncope Appreciate CV eval.  Continue heparin. Plavix. Asa Agree she is not a good candidate for intervention.  Await TTE reading.    Ginnie Smart, MD 07/02/2016, 3:35 PM

## 2016-07-02 NOTE — Progress Notes (Signed)
CRITICAL VALUE ALERT  Critical value received:  Trop 3.75  Date of notification:  07/01/2016  Time of notification:  2019  Critical value read back:Yes.    Nurse who received alert:  mk  MD notified (1st page):  IM  Time of first page:  2020  MD notified (2nd page):  Time of second page:  Responding MD:  IM  Time MD responded:  2040  Asked if wanted lab repeated but MD did not

## 2016-07-02 NOTE — Progress Notes (Signed)
  Echocardiogram 2D Echocardiogram has been performed.  Delcie Roch 07/02/2016, 11:41 AM

## 2016-07-02 NOTE — Progress Notes (Signed)
ANTICOAGULATION CONSULT NOTE - Initial Consult  Pharmacy Consult for Heparin  Indication: NSTEMI  Not on File  Patient Measurements: 44 kg (bed weight by RN)  Vital Signs: Temp: 98.1 F (36.7 C) (11/10 0522) Temp Source: Oral (11/10 0522) BP: 136/75 (11/09 1956) Pulse Rate: 75 (11/10 0522)  Labs:  Recent Labs  07/01/16 0735 07/01/16 1309 07/01/16 1914 07/02/16 0155  HGB 12.0  --   --  10.2*  HCT 35.7*  --   --  31.1*  PLT 192  --   --  200  CREATININE 1.77*  --   --  1.27*  TROPONINI 0.06* 0.60* 3.75* 4.14*    CrCl cannot be calculated (Unknown ideal weight.).   Medical History: Past Medical History:  Diagnosis Date  . CKD (chronic kidney disease) 07/01/2016  . Essential hypertension 07/01/2016  . Glaucoma 07/01/2016  . Presence of stent in coronary artery in patient with coronary artery disease 07/01/2016    Assessment: 82 y/o F to start heparin for NSTEMI. Hgb 10.2, noted renal dysfunction, PTA meds reviewed, received Lovenox 30 mg x 1 last night  Goal of Therapy:  Heparin level 0.3-0.7 units/ml Monitor platelets by anticoagulation protocol: Yes   Plan:  -No bolus with Lovenox last night -Start heparin drip at 550 units/hr -1500 HL -Daily CBC/HL -Monitor for bleeding   Abran Duke 07/02/2016,7:03 AM

## 2016-07-02 NOTE — Progress Notes (Signed)
ANTICOAGULATION CONSULT NOTE  Pharmacy Consult for Heparin  Indication: NSTEMI  Not on File  Patient Measurements: 44 kg (bed weight by RN)  Vital Signs: Temp: 97.9 F (36.6 C) (11/10 1446) Temp Source: Oral (11/10 1446) BP: 146/57 (11/10 1446) Pulse Rate: 72 (11/10 1446)  Labs:  Recent Labs  07/01/16 0735  07/01/16 1914 07/02/16 0155 07/02/16 0737 07/02/16 1836  HGB 12.0  --   --  10.2*  --   --   HCT 35.7*  --   --  31.1*  --   --   PLT 192  --   --  200  --   --   HEPARINUNFRC  --   --   --   --   --  0.32  CREATININE 1.77*  --   --  1.27*  --   --   TROPONINI 0.06*  < > 3.75* 4.14* 2.90*  --   < > = values in this interval not displayed.  CrCl cannot be calculated (Unknown ideal weight.).   Medical History: Past Medical History:  Diagnosis Date  . CKD (chronic kidney disease) 07/01/2016  . Essential hypertension 07/01/2016  . Glaucoma 07/01/2016  . Presence of stent in coronary artery in patient with coronary artery disease 07/01/2016    Assessment: 82 y/o F to start heparin for NSTEMI. Planning medical management at this time.  Initial heparin level is therapeutic at 0.32.  CBC has remained stable with no reported bleeding.   Goal of Therapy:  Heparin level 0.3-0.7 units/ml Monitor platelets by anticoagulation protocol: Yes   Plan:  -Continue heparin drip at 550 units/hr -Heparin level in am to serve as confirmatory level  -Daily CBC/HL -Monitor for bleeding    Pollyann Samples, PharmD, BCPS 07/02/2016, 7:32 PM Pager: [phone]

## 2016-07-02 NOTE — Progress Notes (Signed)
Initial Nutrition Assessment  DOCUMENTATION CODES:   Non-severe (moderate) malnutrition in context of chronic illness  INTERVENTION:   - Provide Ensure Enlive oral nutrition supplement BID. Each provides 350 kcal and 20 grams protein. - Provide Magic Cup oral nutrition supplement TID between meals. Each provides 290 kcal and 9 grams protein. - Encourage PO intake.  NUTRITION DIAGNOSIS:   Malnutrition related to chronic illness as evidenced by severe depletion of muscle mass, moderate depletion of body fat.  GOAL:   Patient will meet greater than or equal to 90% of their needs  MONITOR:   PO intake, Supplement acceptance, Labs, Weight trends, I & O's  REASON FOR ASSESSMENT:   Malnutrition Screening Tool, Consult Assessment of nutrition requirement/status  ASSESSMENT:   82 year old woman who presents after syncope. She woke up this morning around 4:30 AM. Around 5 AM, her niece noticed that she was diaphoretic. She wanted to go to the restroom and her niece helped her walk to the restroom. While she was in the restroom, she had an episode of syncope. She also had a bowel movement during this episode that started as formed stool and towards the end the stool was more runny, burgundy, tarry, and foul-smelling. She regained consciousness after this. Upon EMS arrival she had a second syncopal episode. Pt with history of CKD and HTN.  Spoke with pt at bedside who reports appetite has been poor for the past few months PTA. Pt states she has "never been a big eater." Pt states she might consume coffee and toast for breakfast. Pt unable to provide information regarding foods consumed at lunch and dinner. Pt states she does snack which might include cheese puffs or Pepsi.  Pt reports trying to eat today. Per chart, pt consumed 25% of lunch meal.  Pt states she used to consume Ensure Plus oral nutrition supplements. Pt agreeable to receiving Ensure Enlive oral nutrition supplements BID  during hospital stay. Pt also agreeable to receiving Magic Cup with all meals.  Pt states she is not ambulatory at home and that she mostly "stays in bed." Pt lives with cousin.  No height in chart. No weight history in chart. Pt states her UBW is 110-115#.  Medications reviewed and include 40 mg Pravachol daily, continuous heparin, PRN Miralax  Labs reviewed and include elevated BUN (33 mg/dL), elevated creatinine (2.7 mg/dL), low calcium (8.6 mg/dL), elevated triglycerides and low HDL cholesterol  NFPE: Exam completed. Moderate fat depletion, severe muscle depletion, and no edema noted.  Diet Order:  Diet regular Room service appropriate? Yes; Fluid consistency: Thin  Skin:  Reviewed, no issues  Last BM:  07/01/16  Height:   Ht Readings from Last 1 Encounters:  No data found for Ht    Weight:   Wt Readings from Last 1 Encounters:  07/02/16 97 lb (44 kg)    Ideal Body Weight:   No height available to calculate.  BMI:  There is no height or weight on file to calculate BMI.  Estimated Nutritional Needs:   Kcal:  1200-1400 (27-32 kcal/kg)  Protein:  57-66 grams (1.3-1.5 g/kg)  Fluid:  1.2-1.4 L/day  EDUCATION NEEDS:   No education needs identified at this time  Rosemarie Ax Dietetic Intern Pager Number: [phone]

## 2016-07-02 NOTE — Progress Notes (Signed)
Subjective: Denies chest pain, dyspnea, nausea, or lightheadedness.  Poor appetite, but had a few bites of lunch.   Objective:  Vital signs in last 24 hours: Vitals:   07/01/16 1300 07/01/16 1956 07/02/16 0522 07/02/16 1409  BP: (!) 127/58 136/75    Pulse: 67 64 75   Resp: 19 20 20    Temp: 98.1 F (36.7 C) 97.9 F (36.6 C) 98.1 F (36.7 C)   TempSrc: Oral Oral Oral   SpO2: 100% 100% 99%   Weight:    97 lb (44 kg)   Physical Exam  Constitutional:  Thin, chronically ill-appearing woman  Cardiovascular: Normal rate, regular rhythm and normal heart sounds.   Pulmonary/Chest:  Normal work of breathing, no respiratory distress Moderate crackles in bilateral posterior lung fields  Psychiatric:  Mood depressed, affect constricted Speech volume and quantity reduced Apathetic    CBC Latest Ref Rng & Units 07/02/2016 07/01/2016  WBC 4.0 - 10.5 K/uL 8.6 8.6  Hemoglobin 12.0 - 15.0 g/dL 10.2(L) 12.0  Hematocrit 36.0 - 46.0 % 31.1(L) 35.7(L)  Platelets 150 - 400 K/uL 200 192   BMP Latest Ref Rng & Units 07/02/2016 07/01/2016  Glucose 65 - 99 mg/dL 67 546(F)  BUN 6 - 20 mg/dL 68(L) 27(N)  Creatinine 0.44 - 1.00 mg/dL 1.70(Y) 1.74(B)  Sodium 135 - 145 mmol/L 139 139  Potassium 3.5 - 5.1 mmol/L 3.5 3.4(L)  Chloride 101 - 111 mmol/L 109 105  CO2 22 - 32 mmol/L 19(L) 20(L)  Calcium 8.9 - 10.3 mg/dL 4.4(H) 9.3   Orthostatic VS for the past 24 hrs:  BP- Lying Pulse- Lying BP- Sitting Pulse- Sitting BP- Standing at 0 minutes Pulse- Standing at 0 minutes  07/02/16 0522 129/61 75 114/81 86 128/57 96  07/01/16 1448 127/58 67 104/56 82 97/60 97      Cardiac Panel (last 3 results)  Recent Labs  07/01/16 1914 07/02/16 0155 07/02/16 0737  TROPONINI 3.75* 4.14* 2.90*   Lipid Panel     Component Value Date/Time   CHOL 130 07/02/2016 0737   TRIG 159 (H) 07/02/2016 0737   HDL 17 (L) 07/02/2016 0737   CHOLHDL 7.6 07/02/2016 0737   VLDL 32 07/02/2016 0737   LDLCALC 81  07/02/2016 0737   Component     Latest Ref Rng & Units 07/02/2016  Fecal Occult Blood, POC     NEGATIVE NEGATIVE   Echocardiogram 07/02/2016 Pending  Chest Radiographs 07/01/16 IMPRESSION: COPD. Bibasilar atelectasis or scarring. Cardiomegaly without pulmonary edema.  Aortic atherosclerosis.  Assessment/Plan:  Principal Problem:   NSTEMI (non-ST elevated myocardial infarction) (HCC) Active Problems:   Syncope   Acute kidney injury (HCC)  #NSTEMI Troponins peaked overnight and now decreasing, EKG is difficult to interpret due to V-pacing but no obvious ischemia by Sgarbossa criteria.  Her ACS combined with orthostasis explain her syncopal episode.  Cardiology following, will not catheterize due to comorbidities.  Cardiology will interrogate pacemaker. -Therapeutic heparin for 48 hours -Dual antiplatelet therapy with aspirin and plavix -Continue Telemetry -Repeat CXR with new crackles on exam, concern for pulmonary edema -Appreciate any further cardiology recommendations  #Acute on Chronic Renal Insufficiency Elevated creatinine, BUN/Cr, positive orthostatic vitals and clinically volume depleted on admission consistent with prerenal azotemia.  Endorses very poor PO intake due to anorexia.  Improved today after 1.5L bolus fluids and mIVF since yesterday.  Unknown baseline. -Repeat BMP tomorrow  #HTN Normotensive to mildly hypotensive, likely secondary to hypovolemia and ACS -Hold metoprolol, HCTZ, amlodipine, terazosin  #Concern for  GI Bleed History on admission noted one possible maroon-colored stool on the day of admission, but she seems to not be a reliable historian.  No melenic stools or hematochezzia, FOBT negative, no clinical evidence of GI bleed. -Follow CBC  Dispo: Anticipated discharge in approximately 3 day(s).   Alm BustardMatthew O'Sullivan, MD 07/02/2016, 2:14 PM Pager: [phone]

## 2016-07-02 NOTE — Care Management Obs Status (Signed)
MEDICARE OBSERVATION STATUS NOTIFICATION   Patient Details  Name: Lindsay Rose MRN: [id number] Date of Birth: 02/12/1928   Medicare Observation Status Notification Given:  Yes    Darrold Span, RN 07/02/2016, 11:53 AM

## 2016-07-02 NOTE — Progress Notes (Signed)
   07/02/16 1025  Clinical Encounter Type  Visited With Patient and family together  Visit Type Other (Comment) (Advanced Directive)  Referral From Nurse  Consult/Referral To Chaplain  Spiritual Encounters  Spiritual Needs Emotional  Stress Factors  Patient Stress Factors Health changes  Family Stress Factors Family relationships  Assisted niece and Pt with AD documentation. Notary and witnesses available. Copies given to Pt and copy to chart.

## 2016-07-02 NOTE — Progress Notes (Signed)
Patient Name: Lindsay Rose Date of Encounter: 07/02/2016  Primary Cardiologist: new , Boykin Baetz.   Hospital Problem List     Active Problems:   Syncope   Elevated troponin     Subjective   82 yo female, hx of CAD, presumed CHF ( has ICD) admitted following several episdoes of syncope  Recently moved from [place].  Was not being care for properly by her other family members.  Has had declining health - poor appetite.   Weight loss Has had blood loss Reportedly had an episode of syncope and was brought to the hospital.  She has not lived independently in quite some time. She walks with the assistance of a walker and occasionally uses a wheelchair.  She denies any episodes of chest pain or shortness of breath. She was not a very good historian. Much of the history was obtained from the daughter who was with her this morning.  Inpatient Medications    Scheduled Meds: . aspirin  325 mg Oral Daily  . clopidogrel  300 mg Oral Once  . [START ON 07/03/2016] clopidogrel  75 mg Oral Daily  . feeding supplement (ENSURE ENLIVE)  237 mL Oral BID BM  . pravastatin  40 mg Oral q1800  . sodium chloride flush  3 mL Intravenous Q12H   Continuous Infusions: . heparin     PRN Meds: acetaminophen **OR** acetaminophen, polyethylene glycol   Vital Signs    Vitals:   07/01/16 1236 07/01/16 1300 07/01/16 1956 07/02/16 0522  BP: 128/60 (!) 127/58 136/75   Pulse: 73 67 64 75  Resp: 19 19 20 20   Temp: 97.7 F (36.5 C) 98.1 F (36.7 C) 97.9 F (36.6 C) 98.1 F (36.7 C)  TempSrc: Oral Oral Oral Oral  SpO2: 100% 100% 100% 99%    Intake/Output Summary (Last 24 hours) at 07/02/16 0852 Last data filed at 07/02/16 0200  Gross per 24 hour  Intake           728.33 ml  Output              400 ml  Net           328.33 ml    BP 136/75 (BP Location: Left Arm)   Pulse 75   Temp 98.1 F (36.7 C) (Oral)   Resp 20   SpO2 99%   Physical Exam    GEN: Cachectic black female lying  in bed. She is clearly malnourished. HEENT: Grossly normal.  Neck: Supple, no JVD, carotid bruits, or masses. Cardiac: RRR, soft systolic murmur, no  rubs, or gallops. No clubbing, cyanosis, edema.  Radials/DP/PT 2+ and equal bilaterally.  Respiratory:  Respirations regular and unlabored, clear to auscultation bilaterally. GI: Soft, nontender, nondistended, BS + x 4. MS: She is very thin. Skin: warm and dry, no rash. Neuro:  Strength and sensation are intact. Psych: AAOx3.  Normal affect.  Labs    CBC  Recent Labs  07/01/16 0735 07/02/16 0155  WBC 8.6 8.6  NEUTROABS 5.5  --   HGB 12.0 10.2*  HCT 35.7* 31.1*  MCV 90.2 89.6  PLT 192 200   Basic Metabolic Panel  Recent Labs  07/01/16 0735 07/01/16 1309 07/02/16 0155  NA 139  --  139  K 3.4*  --  3.5  CL 105  --  109  CO2 20*  --  19*  GLUCOSE 112*  --  67  BUN 45*  --  33*  CREATININE 1.77*  --  1.27*  CALCIUM 9.3  --  8.6*  MG  --  1.5*  --    Liver Function Tests  Recent Labs  07/01/16 0735  AST 35  ALT 21  ALKPHOS 67  BILITOT 0.5  PROT 6.6  ALBUMIN 3.1*   No results for input(s): LIPASE, AMYLASE in the last 72 hours. Cardiac Enzymes  Recent Labs  07/01/16 1309 07/01/16 1914 07/02/16 0155  TROPONINI 0.60* 3.75* 4.14*   BNP Invalid input(s): POCBNP D-Dimer No results for input(s): DDIMER in the last 72 hours. Hemoglobin A1C No results for input(s): HGBA1C in the last 72 hours. Fasting Lipid Panel No results for input(s): CHOL, HDL, LDLCALC, TRIG, CHOLHDL, LDLDIRECT in the last 72 hours. Thyroid Function Tests No results for input(s): TSH, T4TOTAL, T3FREE, THYROIDAB in the last 72 hours.  Invalid input(s): FREET3  Telemetry    NSR, V pacing  - Personally Reviewed  ECG    NSR , atrial sensing, ventricular pacing  - Personally Reviewed  Radiology    Dg Chest 2 View  Result Date: 07/01/2016 CLINICAL DATA:  Generalized weakness for the past 2 weeks, syncope. EXAM: CHEST  2 VIEW  COMPARISON:  None in PACs FINDINGS: The lungs are mildly hyperinflated. There is bibasilar density. There is no large pleural effusion. There is no pneumothorax. The heart is mildly enlarged. There is calcification of the mitral annulus. The pulmonary vascularity is normal. The ICD is in reasonable position. There is calcification in the wall of the aortic arch. The observed bony thorax exhibits no acute abnormality. IMPRESSION: COPD. Bibasilar atelectasis or scarring. Cardiomegaly without pulmonary edema. Aortic atherosclerosis. Electronically Signed   By: David  SwazilandJordan M.D.   On: 07/01/2016 08:04    Cardiac Studies     Patient Profile       Assessment & Plan    1. Syncope: She has a pacemaker in place. The etiology of presyncope is not clear. Her overall health has been gradually and steadily declining. She also has a GI bleed.  This syncope may benefit an episode of orthostasis. It's very difficult to get an accurate history from her.  2. Elevated troponin level:  Her troponin this morning is 4.14. She denies any chest pain or shortness breath. The patient is 82 years old and is a DO NOT RESUSCITATE in declining health. I discussed with her and her family that aggressive treatment and evaluation of this elevated troponin level could be quite risky. I would recommend very conservative therapy for her. We'll get an echocardiogram for further evaluation.  I would hesitate to do a heart catheterization unless she is having symptoms of severe chest pain or shortness of breath consistent with a myocardial infarction. Her overall health has been gradually declining and she has a documented GI bleed.  3. Coronary  Artery disease: She has a history of a stent placement 2016. She is on Plavix but this has  been held because of her GI bleeding.`   4.  hypertension: Her blood pressure is slightly low. Were holding her amlodipine, HCTZ, or Zosyn, and metoprolol for now.  4. History of ICD placement: The  patient thinks that she has a [employer] ICD. We will have that interrogated.    Signed, Kristeen MissPhilip Jagdeep Ancheta, MD  07/02/2016, 8:52 AM

## 2016-07-03 DIAGNOSIS — I214 Non-ST elevation (NSTEMI) myocardial infarction: Principal | ICD-10-CM

## 2016-07-03 LAB — CBC
HCT: 30.6 % — ABNORMAL LOW (ref 36.0–46.0)
Hemoglobin: 10.1 g/dL — ABNORMAL LOW (ref 12.0–15.0)
MCH: 29.4 pg (ref 26.0–34.0)
MCHC: 33 g/dL (ref 30.0–36.0)
MCV: 89.2 fL (ref 78.0–100.0)
Platelets: 230 K/uL (ref 150–400)
RBC: 3.43 MIL/uL — ABNORMAL LOW (ref 3.87–5.11)
RDW: 14.4 % (ref 11.5–15.5)
WBC: 9.2 K/uL (ref 4.0–10.5)

## 2016-07-03 LAB — BASIC METABOLIC PANEL WITH GFR
Anion gap: 9 (ref 5–15)
BUN: 23 mg/dL — ABNORMAL HIGH (ref 6–20)
CO2: 22 mmol/L (ref 22–32)
Calcium: 8.6 mg/dL — ABNORMAL LOW (ref 8.9–10.3)
Chloride: 111 mmol/L (ref 101–111)
Creatinine, Ser: 1.03 mg/dL — ABNORMAL HIGH (ref 0.44–1.00)
GFR calc Af Amer: 55 mL/min — ABNORMAL LOW
GFR calc non Af Amer: 48 mL/min — ABNORMAL LOW
Glucose, Bld: 73 mg/dL (ref 65–99)
Potassium: 3.1 mmol/L — ABNORMAL LOW (ref 3.5–5.1)
Sodium: 142 mmol/L (ref 135–145)

## 2016-07-03 LAB — HEPARIN LEVEL (UNFRACTIONATED): Heparin Unfractionated: 0.37 [IU]/mL (ref 0.30–0.70)

## 2016-07-03 LAB — D-DIMER, QUANTITATIVE: D-Dimer, Quant: 3.34 ug/mL-FEU — ABNORMAL HIGH (ref 0.00–0.50)

## 2016-07-03 MED ORDER — CARVEDILOL 3.125 MG PO TABS
3.1250 mg | ORAL_TABLET | Freq: Two times a day (BID) | ORAL | Status: DC
  Administered 2016-07-03 – 2016-07-07 (×8): 3.125 mg via ORAL
  Filled 2016-07-03 (×8): qty 1

## 2016-07-03 MED ORDER — POTASSIUM CHLORIDE CRYS ER 20 MEQ PO TBCR
40.0000 meq | EXTENDED_RELEASE_TABLET | Freq: Once | ORAL | Status: AC
  Administered 2016-07-03: 40 meq via ORAL
  Filled 2016-07-03: qty 2

## 2016-07-03 NOTE — Progress Notes (Signed)
ANTICOAGULATION CONSULT NOTE  Pharmacy Consult for Heparin  Indication: NSTEMI   Assessment: 82 y/o F to start heparin for NSTEMI. Planning medical management at this time.Heparin levels continue to be at goal. CBC has remained stable with no reported bleeding.   Goal of Therapy:  Heparin level 0.3-0.7 units/ml Monitor platelets by anticoagulation protocol: Yes   Plan:  -Continue heparin drip at 550 units/hr -Heparin level in am to serve as confirmatory level  -Daily CBC/HL -Monitor for bleeding  Not on File  Patient Measurements: 44 kg (bed weight by RN)  Vital Signs: Temp: 98.3 F (36.8 C) (11/11 0343) Temp Source: Oral (11/11 0343) BP: 136/74 (11/11 0343) Pulse Rate: 97 (11/11 0343)  Labs:  Recent Labs  07/01/16 0735  07/01/16 1914 07/02/16 0155 07/02/16 0737 07/02/16 1836 07/03/16 0216  HGB 12.0  --   --  10.2*  --   --  10.1*  HCT 35.7*  --   --  31.1*  --   --  30.6*  PLT 192  --   --  200  --   --  230  HEPARINUNFRC  --   --   --   --   --  0.32 0.37  CREATININE 1.77*  --   --  1.27*  --   --  1.03*  TROPONINI 0.06*  < > 3.75* 4.14* 2.90*  --   --   < > = values in this interval not displayed.  CrCl cannot be calculated (Unknown ideal weight.).   Medical History: Past Medical History:  Diagnosis Date  . CKD (chronic kidney disease) 07/01/2016  . Essential hypertension 07/01/2016  . Glaucoma 07/01/2016  . Presence of stent in coronary artery in patient with coronary artery disease 07/01/2016   Sheppard Coil PharmD., BCPS Clinical Pharmacist Pager [phone] 07/03/2016 10:42 AM

## 2016-07-03 NOTE — Progress Notes (Signed)
Subjective: She appears comfortable this morning but is tired and continues to be fairly weak. She denies any chest pain, dyspnea, or lightheadedness.   Objective:  Vital signs in last 24 hours: Vitals:   07/03/16 0343 07/03/16 0644 07/03/16 1314 07/03/16 1727  BP: 136/74   124/72  Pulse: 97   94  Resp: 20  20   Temp: 98.3 F (36.8 C)  97.8 F (36.6 C)   TempSrc: Oral  Oral   SpO2: 97% 98% 100%   Weight:       Physical Exam  Constitutional:  Thin, chronically ill-appearing woman  Cardiovascular: Normal rate, regular rhythm and normal heart sounds.   Pulmonary/Chest:  Normal work of breathing Bibasilar crackles  Psychiatric:  Mood is depressed but affect more appropriate today. Able to smile and laugh a few times with daughter at bedside.    CBC Latest Ref Rng & Units 07/03/2016 07/02/2016 07/01/2016  WBC 4.0 - 10.5 K/uL 9.2 8.6 8.6  Hemoglobin 12.0 - 15.0 g/dL 10.1(L) 10.2(L) 12.0  Hematocrit 36.0 - 46.0 % 30.6(L) 31.1(L) 35.7(L)  Platelets 150 - 400 K/uL 230 200 192   BMP Latest Ref Rng & Units 07/03/2016 07/02/2016 07/01/2016  Glucose 65 - 99 mg/dL 73 67 465(K)  BUN 6 - 20 mg/dL 35(W) 65(K) 81(E)  Creatinine 0.44 - 1.00 mg/dL 7.51(Z) 0.01(V) 4.94(W)  Sodium 135 - 145 mmol/L 142 139 139  Potassium 3.5 - 5.1 mmol/L 3.1(L) 3.5 3.4(L)  Chloride 101 - 111 mmol/L 111 109 105  CO2 22 - 32 mmol/L 22 19(L) 20(L)  Calcium 8.9 - 10.3 mg/dL 9.6(P) 5.9(F) 9.3   Orthostatic VS for the past 24 hrs:  BP- Lying Pulse- Lying BP- Sitting Pulse- Sitting BP- Standing at 0 minutes Pulse- Standing at 0 minutes  07/03/16 1314 130/71 99 133/64 103 104/67 110  07/03/16 0644 136/54 110 133/75 115 138/82 132      Cardiac Panel (last 3 results)  Recent Labs  07/01/16 1914 07/02/16 0155 07/02/16 0737  TROPONINI 3.75* 4.14* 2.90*   Lipid Panel     Component Value Date/Time   CHOL 130 07/02/2016 0737   TRIG 159 (H) 07/02/2016 0737   HDL 17 (L) 07/02/2016 0737   CHOLHDL 7.6  07/02/2016 0737   VLDL 32 07/02/2016 0737   LDLCALC 81 07/02/2016 0737   Component     Latest Ref Rng & Units 07/02/2016  Fecal Occult Blood, POC     NEGATIVE NEGATIVE   Echocardiogram 07/02/2016 LVEF decreased 30-35% and grossly elevated PA pressures.  Chest Radiographs 07/01/16 IMPRESSION: COPD. Bibasilar atelectasis or scarring. Cardiomegaly without pulmonary edema.  Aortic atherosclerosis.  Assessment/Plan:  Principal Problem:   NSTEMI (non-ST elevated myocardial infarction) [facility]) Active Problems:   Syncope   Acute kidney injury (HCC)   Malnutrition of moderate degree   Essential hypertension, benign  #Syncope-NSTEMI Troponin peak at 4.14 following syncopal episode on 11/9. EKG is difficult to interpret due to V-pacing. No CP or dyspnea. Her ACS combined with orthostasis explain her syncopal episode. Cardiology following, may or may not be cath candidate given functional status. Cardiology will interrogate pacemaker. -Therapeutic heparin to continue for at least 24 hours -Dual antiplatelet therapy with aspirin and plavix -Continue Telemetry -Reduce IV fluids with mild pulmonary edema on examination -Appreciate any further cardiology recommendations  #Acute on Chronic Renal Insufficiency Elevated creatinine, BUN/Cr, positive orthostatic vitals and clinically volume depleted on admission consistent with prerenal azotemia.  Endorses very poor PO intake due to anorexia.  Improved  today after 1.5L bolus fluids and mIVF since yesterday.  Unknown baseline. -Repeat BMP tomorrow  #HTN Normotensive to mildly hypotensive, likely secondary to hypovolemia and ACS -Restarting BB for cardiomyopathy - Holding HCTZ, amlodipine, terazosin for now for BP  #Concern for GI Bleed History on admission noted one possible maroon-colored stool on the day of admission. FOBT negative, no clinical evidence of GI bleed during clinical course. So seems unlikely or resolved.  Dispo: Anticipated  discharge in approximately 2 day(s).   Fuller Planhristopher W Frederica Chrestman, MD PGY-II Internal Medicine Resident Pager# [phone] 07/03/2016, 5:58 PM

## 2016-07-03 NOTE — Progress Notes (Signed)
  Date: 07/03/2016  Patient name: Lindsay Rose  Medical record number: [id number]  Date of birth: Jul 02, 1928   This patient's plan of care was discussed with the house staff. Please see their note for complete details. I concur with their findings. Denies pain, resting comfortably.  Blood pressure 136/74, pulse 97, temperature 97.8 F (36.6 C), temperature source Oral, resp. rate 20, weight 44 kg (97 lb), SpO2 100 %. CV- rrr CHest- CTA Abd- BS+, soft, nontender  Labs: K+ 3.1  A/P NSTEMI Appreciate CV f/u Await pacer interrogation  Medical rx (for now)  Supplement potassium  Syncope  Her niece asks if she can travel 6 h after d/c. I told her I thought this was fine. She will be moving to [place] with her daughter at d/c.  She would like help with finding a provider after she moves.   Ginnie Smart, MD 07/03/2016, 1:34 PM

## 2016-07-03 NOTE — Progress Notes (Signed)
    Subjective:  Denies CP or dyspnea   Objective:  Vitals:   07/02/16 1446 07/02/16 1953 07/03/16 0343 07/03/16 0644  BP: (!) 146/57 135/62 136/74   Pulse: 72 82 97   Resp: 19 20 20    Temp: 97.9 F (36.6 C) 98.6 F (37 C) 98.3 F (36.8 C)   TempSrc: Oral Oral Oral   SpO2:  98% 97% 98%  Weight:        Intake/Output from previous day:  Intake/Output Summary (Last 24 hours) at 07/03/16 1210 Last data filed at 07/02/16 1300  Gross per 24 hour  Intake              240 ml  Output                0 ml  Net              240 ml    Physical Exam: Physical exam: Well-developed frail in no acute distress.  Skin is warm and dry.  HEENT is normal.  Neck is supple.  Chest is clear to auscultation with normal expansion.  Cardiovascular exam is regular rate and rhythm.  Abdominal exam nontender or distended. No masses palpated. Extremities show no edema. neuro grossly intact    Lab Results: Basic Metabolic Panel:  Recent Labs  03/88/82 1309 07/02/16 0155 07/03/16 0216  NA  --  139 142  K  --  3.5 3.1*  CL  --  109 111  CO2  --  19* 22  GLUCOSE  --  67 73  BUN  --  33* 23*  CREATININE  --  1.27* 1.03*  CALCIUM  --  8.6* 8.6*  MG 1.5*  --   --    CBC:  Recent Labs  07/01/16 0735 07/02/16 0155 07/03/16 0216  WBC 8.6 8.6 9.2  NEUTROABS 5.5  --   --   HGB 12.0 10.2* 10.1*  HCT 35.7* 31.1* 30.6*  MCV 90.2 89.6 89.2  PLT 192 200 230   Cardiac Enzymes:  Recent Labs  07/01/16 1914 07/02/16 0155 07/02/16 0737  TROPONINI 3.75* 4.14* 2.90*     Assessment/Plan:  82 year old female with past medical history of coronary artery disease (records not available) prior pacemaker placement, hypertension admitted with syncope and elevated troponin. Patient is a no CODE BLUE.  1 syncope-etiology unclear. Patient has a pacemaker or ICD. We will have the device interrogated. Echocardiogram shows ejection fraction 30-35%. Pulmonary pressures severely elevated. Patient  is an extremely poor historian. She apparently had her first episode while urinating and then another episode each being unconscious for 1-1/2 minutes. No chest pain. I will check a d-dimer and if elevated she may require VQ scan or CT to rule out pulmonary embolus. Her troponin is abnormal suggesting NSTEMI. Patient's niece states she would want evaluation if her strength improves. Not clear that she would be a good candidate for cardiac catheterization. We will continue aspirin, Plavix, heparin and statin. Add low-dose carvedilol. We will watch over the next 24-48 hours to see if her strength improves. If so we could consider catheterization. Otherwise would treat medically.  2 coronary artery disease-continue aspirin and statin.  3 prior pacemaker or ICD-we will have the device interrogated.  4 Cardiomyopathy-add carvedilol. Watch renal function and add ACE inhibitor later as blood pressure allows.  5 acute kidney disease-improved with hydration.  6 Question GI bleed-this does not appear to be the case.   Olga Millers 07/03/2016, 12:10 PM

## 2016-07-04 DIAGNOSIS — S40022A Contusion of left upper arm, initial encounter: Secondary | ICD-10-CM

## 2016-07-04 DIAGNOSIS — X58XXXA Exposure to other specified factors, initial encounter: Secondary | ICD-10-CM

## 2016-07-04 LAB — CBC
HEMATOCRIT: 30.8 % — AB (ref 36.0–46.0)
HEMOGLOBIN: 10.3 g/dL — AB (ref 12.0–15.0)
MCH: 29.6 pg (ref 26.0–34.0)
MCHC: 33.4 g/dL (ref 30.0–36.0)
MCV: 88.5 fL (ref 78.0–100.0)
Platelets: 251 10*3/uL (ref 150–400)
RBC: 3.48 MIL/uL — AB (ref 3.87–5.11)
RDW: 14.5 % (ref 11.5–15.5)
WBC: 10.4 10*3/uL (ref 4.0–10.5)

## 2016-07-04 LAB — BASIC METABOLIC PANEL
ANION GAP: 7 (ref 5–15)
BUN: 15 mg/dL (ref 6–20)
CO2: 24 mmol/L (ref 22–32)
Calcium: 8.7 mg/dL — ABNORMAL LOW (ref 8.9–10.3)
Chloride: 111 mmol/L (ref 101–111)
Creatinine, Ser: 0.96 mg/dL (ref 0.44–1.00)
GFR calc Af Amer: >60 mL/min (ref >60)
GFR calc non Af Amer: 52 mL/min — ABNORMAL LOW (ref >60)
GLUCOSE: 106 mg/dL — AB (ref 65–99)
POTASSIUM: 3.7 mmol/L (ref 3.5–5.1)
Sodium: 142 mmol/L (ref 135–145)

## 2016-07-04 LAB — HEPARIN LEVEL (UNFRACTIONATED): Heparin Unfractionated: 0.29 IU/mL — ABNORMAL LOW (ref 0.30–0.70)

## 2016-07-04 MED ORDER — HEPARIN (PORCINE) IN NACL 100-0.45 UNIT/ML-% IJ SOLN
650.0000 [IU]/h | INTRAMUSCULAR | Status: DC
  Administered 2016-07-05: 650 [IU]/h via INTRAVENOUS
  Filled 2016-07-04: qty 250

## 2016-07-04 MED ORDER — LOSARTAN POTASSIUM 25 MG PO TABS
25.0000 mg | ORAL_TABLET | Freq: Every day | ORAL | Status: DC
  Administered 2016-07-04 – 2016-07-06 (×3): 25 mg via ORAL
  Filled 2016-07-04 (×3): qty 1

## 2016-07-04 NOTE — Progress Notes (Signed)
  Date: 07/04/2016  Patient name: Lindsay Rose  Medical record number: [id number]  Date of birth: 05/31/1928   This patient's plan of care was discussed with the house staff. Please see their note for complete details. I concur with their findings. She is without complaints.  Blood pressure 129/65, pulse 96, temperature 98.6 F (37 C), temperature source Oral, resp. rate 18, weight 44 kg (97 lb), SpO2 99 %. CV- rrr Chest- cta abd- bs+, soft, nontender.  L AC fossa hematoma  Labs- reviewed.   A/P NSTEMI Syncope Appreciate CV f/u Await pacer interrogation  Medical rx (for now)  Hematoma Suspect due to asa, heparin, plavix Will continue to monitor.    Ginnie Smart, MD 07/04/2016, 9:29 AM

## 2016-07-04 NOTE — Progress Notes (Signed)
Subjective: She appears comfortable this morning having fingernails painted. She reports a bruise on her arm that has expanded slightly since this morning.   Objective:  Vital signs in last 24 hours: Vitals:   07/03/16 1727 07/03/16 2018 07/04/16 0550 07/04/16 0800  BP: 124/72 129/67 118/60 129/65  Pulse: 94 97 88 96  Resp:  18 18   Temp:  98.7 F (37.1 C) 98.6 F (37 C)   TempSrc:  Oral Oral   SpO2:  100% 99%   Weight:       Physical Exam  Constitutional:  Thin, chronically ill-appearing woman  Cardiovascular: Normal rate, regular rhythm and normal heart sounds.   Pulmonary/Chest:  Normal work of breathing, very faint basilar crackles  Musculoskeletal:  Ecchmoses overlying left antecubital fossa without tenderness or warmth  Psychiatric: She has a normal mood and affect.    CBC Latest Ref Rng & Units 07/04/2016 07/03/2016 07/02/2016  WBC 4.0 - 10.5 K/uL 10.4 9.2 8.6  Hemoglobin 12.0 - 15.0 g/dL 10.3(L) 10.1(L) 10.2(L)  Hematocrit 36.0 - 46.0 % 30.8(L) 30.6(L) 31.1(L)  Platelets 150 - 400 K/uL 251 230 200   BMP Latest Ref Rng & Units 07/04/2016 07/03/2016 07/02/2016  Glucose 65 - 99 mg/dL 130(Q106(H) 73 67  BUN 6 - 20 mg/dL 15 65(H23(H) 84(O33(H)  Creatinine 0.44 - 1.00 mg/dL 9.620.96 9.52(W1.03(H) 4.13(K1.27(H)  Sodium 135 - 145 mmol/L 142 142 139  Potassium 3.5 - 5.1 mmol/L 3.7 3.1(L) 3.5  Chloride 101 - 111 mmol/L 111 111 109  CO2 22 - 32 mmol/L 24 22 19(L)  Calcium 8.9 - 10.3 mg/dL 4.4(W8.7(L) 1.0(U8.6(L) 7.2(Z8.6(L)   Orthostatic VS for the past 24 hrs:  BP- Lying Pulse- Lying BP- Sitting Pulse- Sitting BP- Standing at 0 minutes Pulse- Standing at 0 minutes  07/04/16 0550 118/60 88 106/62 92 121/62 102  07/03/16 1314 130/71 99 133/64 103 104/67 110      Cardiac Panel (last 3 results)  Recent Labs  07/01/16 1914 07/02/16 0155 07/02/16 0737  TROPONINI 3.75* 4.14* 2.90*   Lipid Panel     Component Value Date/Time   CHOL 130 07/02/2016 0737   TRIG 159 (H) 07/02/2016 0737   HDL 17 (L)  07/02/2016 0737   CHOLHDL 7.6 07/02/2016 0737   VLDL 32 07/02/2016 0737   LDLCALC 81 07/02/2016 0737   Component     Latest Ref Rng & Units 07/02/2016  Fecal Occult Blood, POC     NEGATIVE NEGATIVE   Echocardiogram 07/02/2016 LVEF decreased 30-35% and grossly elevated PA pressures.  Chest Radiographs 07/01/16 IMPRESSION: COPD. Bibasilar atelectasis or scarring. Cardiomegaly without pulmonary edema.  Aortic atherosclerosis.  Assessment/Plan:  Principal Problem:   NSTEMI (non-ST elevated myocardial infarction) (HCC) Active Problems:   Syncope   Acute kidney injury (HCC)   Malnutrition of moderate degree   Essential hypertension, benign  #Syncope-NSTEMI Continuing medical management at this time. She is anticoagulated and we also await pacemaker interrogation when possible. She says she will have family coming to visit tomorrow around 1-2pm for a discussion around her plan of care. -Therapeutic heparin continue today -Dual antiplatelet therapy with aspirin and plavix -Continue Telemetry -Appreciate cardiology recommendations  #Acute on Chronic Renal Insufficiency SCr, BUN returned to roughly normal levels (SCr likely to overestiamte CrCl given her low body mass) after most likely a prerenal azotemia now improved. -Can hold further IVF unless needed -Repeat BMP tomorrow  #HTN Normotensive to mildly hypotensive, likely secondary to hypovolemia and ACS. She may be able to tolerate additional  therapies at low dose but would need orthostatics monitored. -Continue BB for cardiomyopathy -Can start low dose ACE/ARB if tolerating -Holding HCTZ, amlodipine, terazosin for now for BP   Dispo: Anticipated discharge in approximately 1-2 day(s).   Fuller Plan, MD PGY-II Internal Medicine Resident Pager# [phone] 07/04/2016, 11:17 AM

## 2016-07-04 NOTE — Progress Notes (Signed)
ANTICOAGULATION CONSULT NOTE  Pharmacy Consult for Heparin  Indication: NSTEMI   Assessment: 82 y/o F on heparin for NSTEMI. Planning medical management at this time. Heparin level slightly below goal at 0.29 this AM. CBC has remained stable with no reported bleeding.   Goal of Therapy:  Heparin level 0.3-0.7 units/ml Monitor platelets by anticoagulation protocol: Yes   Plan:  -Increase heparin drip to 650 units/hr -Did not give a bolus since this level is borderline in range -8 hour heparin level at 1600 -Daily CBC/HL -Monitor for bleeding  Not on File  Patient Measurements: 44 kg (bed weight by RN)  Vital Signs: Temp: 98.6 F (37 C) (11/12 0550) Temp Source: Oral (11/12 0550) BP: 118/60 (11/12 0550) Pulse Rate: 88 (11/12 0550)  Labs:  Recent Labs  07/01/16 1914 07/02/16 0155 07/02/16 0737 07/02/16 1836 07/03/16 0216 07/04/16 0118  HGB  --  10.2*  --   --  10.1* 10.3*  HCT  --  31.1*  --   --  30.6* 30.8*  PLT  --  200  --   --  230 251  HEPARINUNFRC  --   --   --  0.32 0.37 0.29*  CREATININE  --  1.27*  --   --  1.03* 0.96  TROPONINI 3.75* 4.14* 2.90*  --   --   --     CrCl cannot be calculated (Unknown ideal weight.).   Medical History: Past Medical History:  Diagnosis Date  . CKD (chronic kidney disease) 07/01/2016  . Essential hypertension 07/01/2016  . Glaucoma 07/01/2016  . Presence of stent in coronary artery in patient with coronary artery disease 07/01/2016   Gwyndolyn Kaufman Bernette Redbird), PharmD  PGY1 Pharmacy Resident Pager: [phone] 07/04/2016 6:53 AM

## 2016-07-04 NOTE — Progress Notes (Signed)
Subjective:   82 yo female, hx of CAD, presumed CHF ( has ICD) admitted following several episdoes of syncope  Recently moved from [place].  Was not being care for properly by her other family members.  Has had declining health - poor appetite.   Weight loss  She remains weak. Daughter says she is improving though. Was able to sit up in chair for a bit today and eat. Now back in bed. Denies CP or dyspnea  Echo reviewed personally: LVEF 35%, Global HK. RV normal. RVSP 65mm HG  Scheduled Meds: . aspirin EC  81 mg Oral Daily  . carvedilol  3.125 mg Oral BID WC  . clopidogrel  300 mg Oral Once  . clopidogrel  75 mg Oral Daily  . feeding supplement (ENSURE ENLIVE)  237 mL Oral BID BM  . pravastatin  40 mg Oral q1800  . sodium chloride flush  3 mL Intravenous Q12H   Continuous Infusions: . heparin 650 Units/hr (07/04/16 0800)   PRN Meds:.acetaminophen **OR** acetaminophen, polyethylene glycol    Objective:  Vitals:   07/03/16 1727 07/03/16 2018 07/04/16 0550 07/04/16 0800  BP: 124/72 129/67 118/60 129/65  Pulse: 94 97 88 96  Resp:  18 18   Temp:  98.7 F (37.1 C) 98.6 F (37 C)   TempSrc:  Oral Oral   SpO2:  100% 99%   Weight:        Intake/Output from previous day:  Intake/Output Summary (Last 24 hours) at 07/04/16 1244 Last data filed at 07/04/16 0630  Gross per 24 hour  Intake              215 ml  Output              210 ml  Net                5 ml    Physical Exam: Physical exam: Frail cachetic in no acute distress. Curled up in bed.  NAD Skin is warm and dry.  HEENT is normal.  Neck is supple.  Chest is clear to auscultation with normal expansion.  Cardiovascular exam is regular rate and rhythm.  Abdominal exam nontender or distended. No masses palpated. Extremities show no edema. neuro grossly intact    Lab Results: Basic Metabolic Panel:  Recent Labs  41/93/79 1309  07/03/16 0216 07/04/16 0118  NA  --   < > 142 142  K  --   < > 3.1*  3.7  CL  --   < > 111 111  CO2  --   < > 22 24  GLUCOSE  --   < > 73 106*  BUN  --   < > 23* 15  CREATININE  --   < > 1.03* 0.96  CALCIUM  --   < > 8.6* 8.7*  MG 1.5*  --   --   --   < > = values in this interval not displayed. CBC:  Recent Labs  07/03/16 0216 07/04/16 0118  WBC 9.2 10.4  HGB 10.1* 10.3*  HCT 30.6* 30.8*  MCV 89.2 88.5  PLT 230 251   Cardiac Enzymes:  Recent Labs  07/01/16 1914 07/02/16 0155 07/02/16 0737  TROPONINI 3.75* 4.14* 2.90*     Assessment/Plan:  82 year old female with past medical history of coronary artery disease (records not available) prior pacemaker placement, hypertension admitted with syncope and elevated troponin. Patient is a no CODE BLUE.  1 syncope-etiology unclear.  - no  arrhythmias on tele -Patient has a pacemaker or ICD. We will have the device interrogated in am (unclear what make of device it is)  - Echocardiogram shows ejection fraction 35%. Pulmonary pressures moderately elevated but RV is normal so doubt this is related [facility]. -Both d-dimer and troponin are elevated so PE is a possibility however RV is normal. -Will check LE Dopplers. If + will need CT or VQ to exclude PE. Continue heparin for now  2 coronary artery disease/NSTEMI -No current CP or SOB - Poor candidate for cardiac cath at this point due to poor functional status and lack of symptoms -continue aspirin, b-blocker and statin. - On heparin for now pending results of DVT/PE workup. Hope to stop soon.   3 prior pacemaker or ICD-we will have the device interrogated.  4 Chronic systolic HF (presumed) -EF 35% by echo. Has ICD (or PM) -Volume status looks Ok  - Continue carvedilol. Add low dose losartan  5 acute kidney disease-improved with hydration.  6 Failure to thrive - overall I think the main issue here is failure to thrive and not progressive HF or ACS.  - would favor conservative cardiac care with focus on rehab. If not improving, would need  Palliative care   Arvilla MeresBensimhon, Alyssamae Klinck  MD 07/04/2016, 12:44 PM

## 2016-07-04 NOTE — Progress Notes (Signed)
OT Cancellation Note  Patient Details Name: Lindsay Rose MRN: [id number] DOB: 06/12/1928   Cancelled Treatment:    Reason Eval/Treat Not Completed: Fatigue/lethargy limiting ability to participate.  Pt. Asleep upon my arrival into room. Family member that was present states pt. Was up all morning and had just recently returned to bed to rest.  Requests therapy after 2pm today.  Reviewed if we are unable to get back this pm we would re-attempt first of the week.    Robet Leu, COTA/L 07/04/2016, 11:17 AM

## 2016-07-05 ENCOUNTER — Inpatient Hospital Stay (HOSPITAL_COMMUNITY): Payer: Medicare PPO

## 2016-07-05 ENCOUNTER — Encounter (HOSPITAL_COMMUNITY): Payer: Self-pay | Admitting: Radiology

## 2016-07-05 DIAGNOSIS — R55 Syncope and collapse: Secondary | ICD-10-CM

## 2016-07-05 DIAGNOSIS — F332 Major depressive disorder, recurrent severe without psychotic features: Secondary | ICD-10-CM

## 2016-07-05 DIAGNOSIS — R778 Other specified abnormalities of plasma proteins: Secondary | ICD-10-CM

## 2016-07-05 DIAGNOSIS — R7989 Other specified abnormal findings of blood chemistry: Secondary | ICD-10-CM

## 2016-07-05 LAB — CBC
HEMATOCRIT: 31.3 % — AB (ref 36.0–46.0)
Hemoglobin: 10.3 g/dL — ABNORMAL LOW (ref 12.0–15.0)
MCH: 29.2 pg (ref 26.0–34.0)
MCHC: 32.9 g/dL (ref 30.0–36.0)
MCV: 88.7 fL (ref 78.0–100.0)
PLATELETS: 252 10*3/uL (ref 150–400)
RBC: 3.53 MIL/uL — ABNORMAL LOW (ref 3.87–5.11)
RDW: 14.7 % (ref 11.5–15.5)
WBC: 11.8 10*3/uL — ABNORMAL HIGH (ref 4.0–10.5)

## 2016-07-05 LAB — BASIC METABOLIC PANEL
Anion gap: 8 (ref 5–15)
BUN: 12 mg/dL (ref 6–20)
CHLORIDE: 110 mmol/L (ref 101–111)
CO2: 24 mmol/L (ref 22–32)
CREATININE: 0.96 mg/dL (ref 0.44–1.00)
Calcium: 8.7 mg/dL — ABNORMAL LOW (ref 8.9–10.3)
GFR calc Af Amer: >60 mL/min (ref >60)
GFR calc non Af Amer: 52 mL/min — ABNORMAL LOW (ref >60)
Glucose, Bld: 84 mg/dL (ref 65–99)
Potassium: 3.6 mmol/L (ref 3.5–5.1)
SODIUM: 142 mmol/L (ref 135–145)

## 2016-07-05 LAB — HEPARIN LEVEL (UNFRACTIONATED): HEPARIN UNFRACTIONATED: 0.57 [IU]/mL (ref 0.30–0.70)

## 2016-07-05 MED ORDER — CITALOPRAM HYDROBROMIDE 20 MG PO TABS
20.0000 mg | ORAL_TABLET | Freq: Every day | ORAL | Status: DC
  Administered 2016-07-05 – 2016-07-07 (×3): 20 mg via ORAL
  Filled 2016-07-05 (×3): qty 1

## 2016-07-05 MED ORDER — IOPAMIDOL (ISOVUE-370) INJECTION 76%
INTRAVENOUS | Status: AC
  Administered 2016-07-05: 100 mL
  Filled 2016-07-05: qty 100

## 2016-07-05 NOTE — [place] FL2 (Signed)
[place] MEDICAID FL2 LEVEL OF CARE SCREENING TOOL     IDENTIFICATION  Patient Name: Lindsay Rose Birthdate: January 29, 1928 Sex: female Admission Date (Current Location): 07/01/2016  [facility] [place]   Facility and Address:  [facility], [address], [place], [place] [address]      Provider Number: [id number]  Attending Physician Name and Address:  Ginnie SmartJeffrey C Hatcher, MD  Relative Name and Phone Number:       Current Level of Care: Hospital Recommended Level of Care: Skilled Nursing Facility Prior Approval Number:    Date Approved/Denied:   PASRR [place]  Discharge Plan: SNF    Current Diagnoses: Patient Active Problem List   Diagnosis Date Noted  . Elevated troponin   . Positive D dimer   . Acute kidney injury (HCC) 07/02/2016  . Malnutrition of moderate degree 07/02/2016  . Essential hypertension, benign 07/02/2016  . NSTEMI (non-ST elevated myocardial infarction) (HCC)   . Syncope 07/01/2016    Orientation RESPIRATION BLADDER Height & Weight     Self, Time, Situation, Place  Normal Continent Weight: 97 lb (44 kg) Height:     BEHAVIORAL SYMPTOMS/MOOD NEUROLOGICAL BOWEL NUTRITION STATUS   (None)  (None) Continent Diet (Regular)  AMBULATORY STATUS COMMUNICATION OF NEEDS Skin   Extensive Assist Verbally Normal                       Personal Care Assistance Level of Assistance  Bathing, Feeding, Dressing Bathing Assistance: Limited assistance Feeding assistance: Independent Dressing Assistance: Limited assistance     Functional Limitations Info  Sight, Hearing, Speech Sight Info: Adequate Hearing Info: Adequate Speech Info: Adequate    SPECIAL CARE FACTORS FREQUENCY  PT (By licensed PT), Blood pressure, OT (By licensed OT)     PT Frequency: 5 x week OT Frequency: 5 x week            Contractures Contractures Info: Not present    Additional Factors Info  Code Status,  Allergies Code Status Info: DNR Allergies Info: NKDA           Current Medications (07/05/2016):  This is the current hospital active medication list Current Facility-Administered Medications  Medication Dose Route Frequency Provider Last Rate Last Dose  . acetaminophen (TYLENOL) tablet 650 mg  650 mg Oral Q6H PRN Lora PaulaJennifer T Krall, MD       Or  . acetaminophen (TYLENOL) suppository 650 mg  650 mg Rectal Q6H PRN Lora PaulaJennifer T Krall, MD      . aspirin EC tablet 81 mg  81 mg Oral Daily Little IshikawaErin E Smith, NP   81 mg at 07/05/16 1119  . carvedilol (COREG) tablet 3.125 mg  3.125 mg Oral BID WC Lewayne BuntingBrian S Crenshaw, MD   3.125 mg at 07/05/16 0847  . citalopram (CELEXA) tablet 20 mg  20 mg Oral Daily Alm BustardMatthew O'Sullivan, MD   20 mg at 07/05/16 1234  . clopidogrel (PLAVIX) tablet 300 mg  300 mg Oral Once Alm BustardMatthew O'Sullivan, MD      . clopidogrel (PLAVIX) tablet 75 mg  75 mg Oral Daily Alm BustardMatthew O'Sullivan, MD   75 mg at 07/05/16 1120  . feeding supplement (ENSURE ENLIVE) (ENSURE ENLIVE) liquid 237 mL  237 mL Oral BID BM Ailene Ardsatherine C Lamberton, RD   237 mL at 07/05/16 1120  . heparin ADULT infusion 100 units/mL (25000 units/27850mL sodium chloride 0.45%)  650 Units/hr Intravenous Continuous Jenita SeashoreKai N Kang, RPH 6.5  mL/hr at 07/05/16 0635 650 Units/hr at 07/05/16 0635  . losartan (COZAAR) tablet 25 mg  25 mg Oral QHS Dolores Patty, MD   25 mg at 07/04/16 2216  . polyethylene glycol (MIRALAX / GLYCOLAX) packet 17 g  17 g Oral Daily PRN Lora Paula, MD      . pravastatin (PRAVACHOL) tablet 40 mg  40 mg Oral q1800 Lora Paula, MD   40 mg at 07/04/16 1811  . sodium chloride flush (NS) 0.9 % injection 3 mL  3 mL Intravenous Q12H Lora Paula, MD   3 mL at 07/05/16 1000     Discharge Medications: Please see discharge summary for a list of discharge medications.  Relevant Imaging Results:  Relevant Lab Results:   Additional Information SS#: [id number]  Margarito Liner, LCSW

## 2016-07-05 NOTE — Progress Notes (Signed)
Hospital Problem List     Principal Problem:   NSTEMI (non-ST elevated myocardial infarction) (HCC) Active Problems:   Syncope   Acute kidney injury (HCC)   Malnutrition of moderate degree   Essential hypertension, benign   Patient Profile:   82 year old woman with history of CAD, CKD, HTN who presents after syncope.  Subjective   No events overnight. Denies any chest pain, shortness of breath or palpations. She says that she tried to eat her breakfast this morning but could not because of jaw pain. Says that her dentures are not secure and it it is causing left sided pain when she eats.   Inpatient Medications    . aspirin EC  81 mg Oral Daily  . carvedilol  3.125 mg Oral BID WC  . clopidogrel  300 mg Oral Once  . clopidogrel  75 mg Oral Daily  . feeding supplement (ENSURE ENLIVE)  237 mL Oral BID BM  . losartan  25 mg Oral QHS  . pravastatin  40 mg Oral q1800  . sodium chloride flush  3 mL Intravenous Q12H    Vital Signs    Vitals:   07/03/16 2018 07/04/16 0550 07/04/16 0800 07/04/16 2146  BP: 129/67 118/60 129/65 117/62  Pulse: 97 88 96 90  Resp: 18 18  18   Temp: 98.7 F (37.1 C) 98.6 F (37 C)  98.1 F (36.7 C)  TempSrc: Oral Oral  Oral  SpO2: 100% 99%  99%  Weight:        Intake/Output Summary (Last 24 hours) at 07/05/16 0846 Last data filed at 07/05/16 1610  Gross per 24 hour  Intake              125 ml  Output                0 ml  Net              125 ml   Filed Weights   07/02/16 1409  Weight: 97 lb (44 kg)    Physical Exam    General: Cachetic and frail elderly female lying in bed.  Head: Normocephalic, atraumatic.  Neck: Supple without bruits, no JVD. Lungs:  Resp regular and unlabored, CTA. Heart: RRR, S1, S2, no S3, S4, or murmur; no rub. Abdomen: Soft, non-tender, non-distended with normoactive bowel sounds. Extremities: No clubbing, cyanosis, or edema. Distal pedal pulses are 2+ bilaterally. Neuro: Alert and oriented X 3. Moves  all extremities spontaneously. Psych: Normal affect.  Labs    CBC  Recent Labs  07/04/16 0118 07/05/16 0155  WBC 10.4 11.8*  HGB 10.3* 10.3*  HCT 30.8* 31.3*  MCV 88.5 88.7  PLT 251 252   Basic Metabolic Panel  Recent Labs  07/04/16 0118 07/05/16 0155  NA 142 142  K 3.7 3.6  CL 111 110  CO2 24 24  GLUCOSE 106* 84  BUN 15 12  CREATININE 0.96 0.96  CALCIUM 8.7* 8.7*   D-Dimer  Recent Labs  07/03/16 1316  DDIMER 3.34*    Telemetry    NSR, V pacing, occasional PVCs  ECG    NSR , atrial sensing, ventricular pacing    Cardiac Studies and Radiology    Dg Chest 2 View  Result Date: 07/01/2016 CLINICAL DATA:  Generalized weakness for the past 2 weeks, syncope. EXAM: CHEST  2 VIEW COMPARISON:  None in PACs FINDINGS: The lungs are mildly hyperinflated. There is bibasilar density. There is no large pleural effusion. There is no  pneumothorax. The heart is mildly enlarged. There is calcification of the mitral annulus. The pulmonary vascularity is normal. The ICD is in reasonable position. There is calcification in the wall of the aortic arch. The observed bony thorax exhibits no acute abnormality. IMPRESSION: COPD. Bibasilar atelectasis or scarring. Cardiomegaly without pulmonary edema. Aortic atherosclerosis. Electronically Signed   By: David  [place] M.D.   On: 07/01/2016 08:04   Dg Chest Port 1 View  Result Date: 07/02/2016 CLINICAL DATA:  Pulmonary edema EXAM: PORTABLE CHEST 1 VIEW COMPARISON:  07/01/2016 FINDINGS: Cardiac pacemaker. Mild cardiac enlargement without vascular congestion. Emphysematous changes in the lungs. Scarring in the lung bases appears to be chronic. No focal consolidation or airspace disease. No blunting of costophrenic angles. No pneumothorax. Calcified and tortuous aorta. IMPRESSION: Cardiac enlargement with mild emphysematous changes and basilar fibrosis. No edema or consolidation. Electronically Signed   By: Burman Nieves M.D.   On:  07/02/2016 21:17    Echocardiogram:  Study Conclusions  - Left ventricle: The cavity size was normal. There was mild   concentric hypertrophy. Systolic function was moderately to   severely reduced. The estimated ejection fraction was in the   range of 30% to 35%. Wall motion was normal; there were no   regional wall motion abnormalities. Doppler parameters are   consistent with abnormal left ventricular relaxation (grade 1   diastolic dysfunction). Doppler parameters are consistent with   elevated ventricular end-diastolic filling pressure. - Aortic valve: Trileaflet; mildly thickened, mildly calcified   leaflets. There was trivial regurgitation. - Aortic root: The aortic root was normal in size. - Ascending aorta: The ascending aorta was normal in size. - Mitral valve: Structurally normal valve. There was mild   regurgitation. - Right ventricle: The cavity size was normal. Wall thickness was   normal. Systolic function was normal. - Right atrium: Pacer wire or catheter noted in right atrium. - Tricuspid valve: There was moderate regurgitation. - Pulmonic valve: There was no regurgitation. - Pulmonary arteries: Systolic pressure was severely increased. PA   peak pressure: 67 mm Hg (S). - Inferior vena cava: The vessel was dilated. The respirophasic   diameter changes were blunted (< 50%), consistent with elevated   central venous pressure. - Pericardium, extracardiac: There was no pericardial effusion.  Assessment & Plan    Syncope  Etiology remains unclear. She has not had any arrhthymias on telemetry. She has a pacemaker or ICD, patient is unsure why she has the device. Echocardiogram shows ejection fraction 35%. Pulmonary pressures moderately elevated but RV is normal so doubt this is related to [facility]. Both d-dimer and troponin are elevated so PE is a possibility however RV is normal. -We will have the device interrogated today (unclear what make of device it is)  -Will check  LE Dopplers. With her elevated D-Dimer of 3 and AKI resolved, will go a head and get a CTA. Continue heparin for now  Coronary artery disease/NSTEMI She has not had any CP or SOB. She had a peak troponin of 4.14 on admission which was down trending. She is unfortunately a poor candidate for cardiac cath at this point due to poor functional status and lack of symptoms. Treating with medical therapy.  -Continue aspirin, b-blocker and statin. -On heparin for now pending results of DVT/PE workup.    Prior pacemaker or ICD Will have the device interrogated  Chronic systolic HF (presumed) -EF 35% by echo. Has ICD (or PM) -Volume status looks Ok  -Continue carvedilol and low dose  losartan  AKI Resovled  Failure to thrive Overall I think the main issue here is failure to thrive and not progressive HF or ACS.  Would favor conservative cardiac care with focus on rehab. If not improving, would need Palliative care  Signed, Valentino Noseathan Jacquiline Zurcher PGY2 IM Resident [phone] 8:46 AM 07/05/2016

## 2016-07-05 NOTE — Progress Notes (Signed)
*  PRELIMINARY RESULTS* Vascular Ultrasound Lower extremity venous duplex has been completed.  Preliminary findings: No evidence of deep vein thrombosis or baker's cysts bilaterally.   Lindsay Rose 07/05/2016, 11:04 AM

## 2016-07-05 NOTE — Clinical Social Work Note (Signed)
Patient getting a bath but CSW spoke with her niece. Per niece, patient is moving back to [place], [place] and will likely need a SNF there. Patient's daughter will be at the hospital within the next 30 mins-1 hour. CSW provided contact information for her to call when she arrived.  Charlynn Court, CSW [phone]

## 2016-07-05 NOTE — Progress Notes (Signed)
Occupational Therapy Treatment Patient Details Name: Lindsay Rose MRN: [id number] DOB: Feb 28, 1928 Today's Date: 07/05/2016    History of present illness Patient is an 82 yo female admitted 07/01/16 following 2 syncopal episodes, with NSTEMI and FTT.  Patient with elevated troponin, now trending downward.    PMH:  pacemaker, CAD, stents, CKD, HTN, FTT, GIB   OT comments  Pt making progress with functional goals, however pt declined bathing this morning and required encouragement to sit up in recliner for at least an hour. Pt asking when she could get back into bed after being in recliner < 5 minutes. Educated pt on the benefits of being OOB. OT will continue to follow acutely  Follow Up Recommendations  SNF    Equipment Recommendations  Other (comment);Tub/shower seat (TBD at next venue of care)    Recommendations for Other Services      Precautions / Restrictions Precautions Precautions: Fall Restrictions Weight Bearing Restrictions: No       Mobility Bed Mobility Overal bed mobility: Needs Assistance Bed Mobility: Supine to Sit     Supine to sit: HOB elevated;Min guard        Transfers Overall transfer level: Needs assistance Equipment used: 1 person hand held assist Transfers: Sit to/from [employer] Sit to Stand: Min assist Stand pivot transfers: Min assist       General transfer comment: pt required encouragement to get OOB    Balance     Sitting balance-Leahy Scale: Fair       Standing balance-Leahy Scale: Poor                     ADL       Grooming: Wash/dry hands;Wash/dry face;Sitting;Set up     Upper Body Bathing Details (indicate cue type and reason): pt declined   Lower Body Bathing Details (indicate cue type and reason): pt declined Upper Body Dressing : Set up;Sitting   Lower Body Dressing: Min guard;Sitting/lateral leans Lower Body Dressing Details (indicate cue type and reason): donned socks Toilet  Transfer: Minimal assistance;BSC;Stand-pivot   Toileting- Clothing Manipulation and Hygiene: Sit to/from stand;Min guard         General ADL Comments: pt declined bathing this morning and required encouragement to sit up in recliner for at least an hour      Vision                                Cognition   Behavior During Therapy: [facility] for tasks assessed/performed Overall Cognitive Status: No family/caregiver present to determine baseline cognitive functioning                       Extremity/Trunk Assessment   generalized weakness                        General Comments  Pt very pleasant and cooperative    Pertinent Vitals/ Pain       Pain Assessment: 0-10 Pain Score: 4  Pain Location: mouth/jaw Pain Descriptors / Indicators: Sore Pain Intervention(s): Monitored during session  Frequency  Min 2X/week        Progress Toward Goals  OT Goals(current goals can now be found in the care plan section)  Progress towards OT goals: Progressing toward goals  Acute Rehab OT Goals Patient Stated Goal: feel better and go home  Plan Discharge plan needs to be updated                     End of Session Equipment Utilized During Treatment: Other (comment);Gait belt (BSC)   Activity Tolerance Patient tolerated treatment well   Patient Left with call bell/phone within reach;in chair;with chair alarm set             Time: [id number] OT Time Calculation (min): 26 min  Charges: OT General Charges $OT Visit: 1 Procedure OT Treatments $Self Care/Home Management : 8-22 mins $Therapeutic Activity: 8-22 mins  Galen ManilaSpencer, Mubashir Mallek Jeanette 07/05/2016, 12:10 PM

## 2016-07-05 NOTE — Clinical Social Work Placement (Signed)
   CLINICAL SOCIAL WORK PLACEMENT  NOTE  Date:  07/05/2016  Patient Details  Name: Lindsay Rose MRN: [id number] Date of Birth: [birth year]  Clinical Social Work is seeking post-discharge placement for this patient at the Skilled  Nursing Facility level of care (*CSW will initial, date and re-position this form in  chart as items are completed):  Yes   Patient/family provided with [facility] Clinical Social Work Department's list of facilities offering this level of care within the geographic area requested by the patient (or if unable, by the patient's family).  Yes   Patient/family informed of their freedom to choose among providers that offer the needed level of care, that participate in Medicare, Medicaid or managed care program needed by the patient, have an available bed and are willing to accept the patient.  Yes   Patient/family informed of [facility]'s ownership interest in [facility] and [facility], as well as of the fact that they are under no obligation to receive care at these facilities.  PASRR submitted to [employer] on  (Faxing out to SNF in [place]. No need for PASARR.)     PASRR number received on       Existing PASRR number confirmed on       FL2 transmitted to all facilities in geographic area requested by pt/family on 07/05/16     FL2 transmitted to all facilities within larger geographic area on       Patient informed that his/her managed care company has contracts with or will negotiate with certain facilities, including the following:            Patient/family informed of bed offers received.  Patient chooses bed at       Physician recommends and patient chooses bed at      Patient to be transferred to   on  .  Patient to be transferred to facility by       Patient family notified on   of transfer.  Name of family member notified:        PHYSICIAN Please sign FL2     Additional Comment:     _______________________________________________ Margarito Liner, LCSW 07/05/2016, 3:37 PM

## 2016-07-05 NOTE — Progress Notes (Signed)
Subjective: Denies chest pain or dyspnea.  Reports she has been trying to eat her meals and will have some breakfast when her tray arrives.  She acknowledges that sometimes she doesn't eat when she gets depressed and wasn't taking care of herself prior to the hospitalization.  She has been on an antidepressant in the past, which she does not remember the name of, which she thinks helped.  Objective:  Vital signs in last 24 hours: Vitals:   07/03/16 2018 07/04/16 0550 07/04/16 0800 07/04/16 2146  BP: 129/67 118/60 129/65 117/62  Pulse: 97 88 96 90  Resp: 18 18  18   Temp: 98.7 F (37.1 C) 98.6 F (37 C)  98.1 F (36.7 C)  TempSrc: Oral Oral  Oral  SpO2: 100% 99%  99%  Weight:       Physical Exam  Constitutional:  Cachectic, chronically ill-appearing woman in no distress  Cardiovascular: Normal rate, regular rhythm and normal heart sounds.   Pulmonary/Chest:  Normal work of breathing, no respiratory distress, lungs CTAB Psychiatric:  Mood much improved from admission, affect reactive    CBC Latest Ref Rng & Units 07/05/2016 07/04/2016 07/03/2016  WBC 4.0 - 10.5 K/uL 11.8(H) 10.4 9.2  Hemoglobin 12.0 - 15.0 g/dL 10.3(L) 10.3(L) 10.1(L)  Hematocrit 36.0 - 46.0 % 31.3(L) 30.8(L) 30.6(L)  Platelets 150 - 400 K/uL 252 251 230   BMP Latest Ref Rng & Units 07/05/2016 07/04/2016 07/03/2016  Glucose 65 - 99 mg/dL 84 161(W106(H) 73  BUN 6 - 20 mg/dL 12 15 96(E23(H)  Creatinine 0.44 - 1.00 mg/dL 4.540.96 0.980.96 1.19(J1.03(H)  Sodium 135 - 145 mmol/L 142 142 142  Potassium 3.5 - 5.1 mmol/L 3.6 3.7 3.1(L)  Chloride 101 - 111 mmol/L 110 111 111  CO2 22 - 32 mmol/L 24 24 22   Calcium 8.9 - 10.3 mg/dL 4.7(W8.7(L) 2.9(F8.7(L) 6.2(Z8.6(L)    Echocardiogram 07/02/2016 EF 30-35% Grade 1 diastolic dysfunction Increase PA pressure (67 mmHg peak) Normal RV size and function   Assessment/Plan:  Principal Problem:   NSTEMI (non-ST elevated myocardial infarction) (HCC) Active Problems:   Syncope   Acute kidney injury  (HCC)   Malnutrition of moderate degree   Essential hypertension, benign  #NSTEMI #Syncope Troponins peaked at 4.14 the night of admission and began falling, never reported symptoms of chest pain or dyspnea.  Her ACS combined with orthostasis explain her syncopal episode.  Cardiology following, not catheterize due to comorbidities.  D-Dimer positive, cardiology ordering DVT US and CTA chest for possible PE as etiology of her syncope. -Continue heparin anticoagulation pending DVT studies -Dual antiplatelet therapy with aspirin and plavix -Continue Telemetry -Interrogate pacemaker -F/u DVT US and CTA chest  #Depression Endorses profound depression leading to apathy, malnourishment, and psychomotor retardation present at admission.  Reports successful treatment with antidepressants in the past, and is interested in resuming treatment. -Start citalopram 20 mg daily  #Acute on Chronic Renal Insufficiency Resolved.  Elevated creatinine, BUN/Cr, positive orthostatic vitals and clinically volume depleted on admission consistent with prerenal azotemia.  Endorses very poor PO intake due to depression and anorexia prior to admission.  Improved after 1.5L bolus fluid and now taking improved PO. -Follow BMP  #HTN She was normotensive to mildly hypotensive after admission and her home metoprolol, HCTZ, amlodipine, and terazosin were held.  Started on carvedilol 3.125 mg BID and losartan 25 mg daily per cardiology and continues to be normotensive. -Continue carvedilol 3.125 mg BID and losartan 25 mg daily  #Concern for GI Bleed No GI  Bleed. History on admission noted one possible maroon-colored stool on the day of admission, but she seems to not be a reliable historian.  No melenic stools or hematochezzia, FOBT negative, no clinical evidence of GI bleed.  Dispo: Anticipated discharge in approximately 2 day(s). Family meeting today to discuss disposition.  Alm Bustard, MD 07/05/2016, 8:33  AM Pager: [phone]

## 2016-07-05 NOTE — Progress Notes (Signed)
ANTICOAGULATION CONSULT NOTE  Pharmacy Consult for Heparin  Indication: NSTEMI   Assessment: 82 y/o F on heparin for NSTEMI. Planning medical management at this time. Per MD, may need to r/o PE. Heparin level therapeutic (0.57) on 650 units/h. CBC has remained stable with no reported bleeding.   Goal of Therapy:  Heparin level 0.3-0.7 units/ml Monitor platelets by anticoagulation protocol: Yes   Plan:  -Continue heparin at 650 units/h -Daily CBC/heparin level -Monitor for s/sx bleeding -F/u plans for heparin, need to r/o PE?  No Known Allergies  Patient Measurements: 44 kg (bed weight by RN)  Vital Signs: Temp: 98.1 F (36.7 C) (11/12 2146) Temp Source: Oral (11/12 2146) BP: 117/62 (11/12 2146) Pulse Rate: 90 (11/12 2146)  Labs:  Recent Labs  07/03/16 0216 07/04/16 0118 07/05/16 0155  HGB 10.1* 10.3* 10.3*  HCT 30.6* 30.8* 31.3*  PLT 230 251 252  HEPARINUNFRC 0.37 0.29* 0.57  CREATININE 1.03* 0.96 0.96    CrCl cannot be calculated (Unknown ideal weight.).   Medical History: Past Medical History:  Diagnosis Date  . CKD (chronic kidney disease) 07/01/2016  . Essential hypertension 07/01/2016  . Glaucoma 07/01/2016  . Presence of stent in coronary artery in patient with coronary artery disease 07/01/2016    Babs Bertin, PharmD, BCPS Clinical Pharmacist 07/05/2016 8:42 AM

## 2016-07-05 NOTE — Clinical Social Work Note (Signed)
Clinical Social Work Assessment  Patient Details  Name: Lindsay Rose MRN: [id number] Date of Birth: [birth year]  Date of referral:  07/05/16               Reason for consult:  Facility Placement, Discharge Planning                Permission sought to share information with:  Facility Sport and exercise psychologist, Family Supports Permission granted to share information::  Yes, Verbal Permission Granted  Name::     Babette Rel[facility] and [facility]::  [facility]  Relationship::  Neice/HCPOA and Daughter  Contact Information:  Eliseo Squires: [phone]: [phone]  Housing/Transportation Living arrangements for the past 2 months:  [place] of Information:  Medical Team, Power of [place], Adult Children Patient Interpreter Needed:  None Criminal Activity/Legal Involvement Pertinent to Current Situation/Hospitalization:  No - Comment as needed Significant Relationships:  Adult Children, Other Family Members Lives with:  Other (Comment) (Neice/HCPOA) Do you feel safe going back to the place where you live?  Yes Need for family participation in patient care:  Yes (Comment)  Care giving concerns:  PT recommending SNF once medically stable for discharge.   Social Worker assessment / plan:  Patient not fully oriented. CSW initially spoke with patient's niece/HCPOA this morning who told CSW to discuss placement with patient's daughter who would be in town from [place], [place] later today. CSW met with patient's daughter, Jeree Delcid [phone]). Patient's daughter interested in [facility], a SNF in [place] where she used to work. Patient's daughter has already reached out to the admissions coordinator. CSW also provided a phone number for patient to apply for Medicaid in [place]. Patient's daughter plans to transport her via car. No further concerns. Referral faxed to facility [phone]). CSW encouraged patient's daughter to contact  CSW as needed. CSW will continue to follow patient and her family for support and facilitate discharge to SNF once medically stable.  Employment status:  Retired Nurse, adult PT Recommendations:  [place] / Referral to community resources:  [place]  Patient/Family's Response to care:  Patient not fully oriented. Patient's daughter and niece/HCPOA agreeable to SNF placement in [place], [place]. Patient's family supportive and involved in patient's care. Patient's family appreciated social work intervention.  Patient/Family's Understanding of and Emotional Response to Diagnosis, Current Treatment, and Prognosis:  Patient not fully oriented. Patient's family understand the need for rehab prior to moving in with her daughter in [place]. Patient's family appear happy with hospital care.  Emotional Assessment Appearance:  Appears stated age Attitude/Demeanor/Rapport:  Unable to Assess Affect (typically observed):  Unable to Assess Orientation:  Oriented to Self, Oriented to Place, Oriented to Situation Alcohol / Substance use:  Never Used Psych involvement (Current and /or in the community):  No (Comment)  Discharge Needs  Concerns to be addressed:  Care Coordination Readmission within the last 30 days:  No Current discharge risk:  Dependent with Mobility Barriers to Discharge:  Ship broker, Other (Patient's daughter plans to have her placed in [place], [place])   Candie Chroman, LCSW 07/05/2016, 3:32 PM

## 2016-07-05 NOTE — Progress Notes (Signed)
  Date: 07/05/2016  Patient name: Lindsay Rose  Medical record number: [id number]  Date of birth: 08/27/1927   This patient's plan of care was discussed with the house staff. Please see their note for complete details. I concur with their findings. Pt with continued c/o of CP.  Will continue her anti-plt agents, heprain.  Will f/u CTA Appreciate CV f/u.  Discussed plan with daughter this AM SNF recommended by PT/OT.    Ginnie Smart, MD 07/05/2016, 12:19 PM

## 2016-07-05 NOTE — Progress Notes (Signed)
Physical Therapy Treatment Patient Details Name: Lindsay Rose MRN: [id number] DOB: [birth year] Today's Date: 07/05/2016    History of Present Illness Patient is an 82 yo female admitted 07/01/16 following 2 syncopal episodes, with NSTEMI and FTT.  Patient with elevated troponin, now trending downward.    PMH:  pacemaker, CAD, stents, CKD, HTN, FTT, GIB    PT Comments    Pt progressing well towards all goals. Pt remains to require minA for safe mobility. Pt to benefit from ST SNF to improve balance and strength to decrease fall risk as pt was ambulating without AD prior to coming in.  Follow Up Recommendations  SNF;Supervision/Assistance - 24 hour     Equipment Recommendations       Recommendations for Other Services       Precautions / Restrictions Precautions Precautions: Fall Restrictions Weight Bearing Restrictions: No    Mobility  Bed Mobility Overal bed mobility: Needs Assistance Bed Mobility: Supine to Sit     Supine to sit: HOB elevated;Min guard Sit to supine: HOB elevated;Min guard   General bed mobility comments: increased time but safe  Transfers Overall transfer level: Needs assistance Equipment used: Rolling walker (2 wheeled) Transfers: Sit to/from Stand Sit to Stand: Min assist Stand pivot transfers: Min assist       General transfer comment: v/c's to push up from bed, increased time, minA to stabilize during transition of hands from bed to RW  Ambulation/Gait Ambulation/Gait assistance: Min guard Ambulation Distance (Feet): 175 Feet Assistive device: Rolling walker (2 wheeled) Gait Pattern/deviations: Step-through pattern Gait velocity: slow Gait velocity interpretation: Below normal speed for age/gender General Gait Details: no episodes of LOB, attempted amb without AD however pt very unsteady requiring min/modA to maintain balance   Stairs            Wheelchair Mobility    Modified Rankin (Stroke Patients Only)        Balance Overall balance assessment: Needs assistance   Sitting balance-Leahy Scale: Fair     Standing balance support: During functional activity Standing balance-Leahy Scale: Fair Standing balance comment: pt stood at sink wigh min guard to wash hands                    Cognition Arousal/Alertness: Awake/alert Behavior During Therapy: WFL for tasks assessed/performed Overall Cognitive Status: Within Functional Limits for tasks assessed                      Exercises      General Comments General comments (skin integrity, edema, etc.): pt assisted to bathroom, pt supervision for hygiene      Pertinent Vitals/Pain Pain Assessment: No/denies pain Pain Score: 4  Pain Location: mouth/jaw Pain Descriptors / Indicators: Sore Pain Intervention(s): Monitored during session    Home Living                      Prior Function            PT Goals (current goals can now be found in the care plan section) Acute Rehab PT Goals Patient Stated Goal: feel better and go home Progress towards PT goals: Progressing toward goals    Frequency    Min 3X/week      PT Plan Current plan remains appropriate    Co-evaluation             End of Session Equipment Utilized During Treatment: Gait belt Activity Tolerance: Patient limited by fatigue Patient left:  in chair;with call bell/phone within reach;with family/visitor present;with chair alarm set     Time: 1421-1450 PT Time Calculation (min) (ACUTE ONLY): 29 min  Charges:  $Gait Training: 8-22 mins $Therapeutic Activity: 8-22 mins                    G Codes:      Ilija Maxim M Shayde Gervacio 07-16-2016, 3:50 PM  Lewis Shock, PT, DPT Pager #: [phone] Office #: [phone]

## 2016-07-06 ENCOUNTER — Encounter (HOSPITAL_COMMUNITY)
Admission: EM | Disposition: A | Discharge status: Skilled Nursing Facility | Payer: Self-pay | Source: Home / Self Care | Attending: Infectious Diseases

## 2016-07-06 DIAGNOSIS — M6281 Muscle weakness (generalized): Secondary | ICD-10-CM

## 2016-07-06 DIAGNOSIS — E440 Moderate protein-calorie malnutrition: Secondary | ICD-10-CM

## 2016-07-06 DIAGNOSIS — I272 Pulmonary hypertension, unspecified: Secondary | ICD-10-CM

## 2016-07-06 DIAGNOSIS — R55 Syncope and collapse: Secondary | ICD-10-CM

## 2016-07-06 DIAGNOSIS — Z7189 Other specified counseling: Secondary | ICD-10-CM

## 2016-07-06 DIAGNOSIS — I2510 Atherosclerotic heart disease of native coronary artery without angina pectoris: Secondary | ICD-10-CM

## 2016-07-06 DIAGNOSIS — Z515 Encounter for palliative care: Secondary | ICD-10-CM

## 2016-07-06 HISTORY — PX: CARDIAC CATHETERIZATION: SHX172

## 2016-07-06 LAB — HEPARIN LEVEL (UNFRACTIONATED): HEPARIN UNFRACTIONATED: 0.7 [IU]/mL (ref 0.30–0.70)

## 2016-07-06 LAB — POCT I-STAT 3, ART BLOOD GAS (G3+)
Acid-base deficit: 2 mmol/L (ref 0.0–2.0)
BICARBONATE: 21.9 mmol/L (ref 20.0–28.0)
O2 Saturation: 99 %
PH ART: 7.453 — AB (ref 7.350–7.450)
TCO2: 23 mmol/L (ref 0–100)
pCO2 arterial: 31.3 mmHg — ABNORMAL LOW (ref 32.0–48.0)
pO2, Arterial: 129 mmHg — ABNORMAL HIGH (ref 83.0–108.0)

## 2016-07-06 LAB — CBC
HCT: 27.6 % — ABNORMAL LOW (ref 36.0–46.0)
HEMOGLOBIN: 9.4 g/dL — AB (ref 12.0–15.0)
MCH: 30.3 pg (ref 26.0–34.0)
MCHC: 34.1 g/dL (ref 30.0–36.0)
MCV: 89 fL (ref 78.0–100.0)
PLATELETS: 251 10*3/uL (ref 150–400)
RBC: 3.1 MIL/uL — AB (ref 3.87–5.11)
RDW: 15.1 % (ref 11.5–15.5)
WBC: 11.3 10*3/uL — ABNORMAL HIGH (ref 4.0–10.5)

## 2016-07-06 LAB — POCT I-STAT 3, VENOUS BLOOD GAS (G3P V)
ACID-BASE DEFICIT: 2 mmol/L (ref 0.0–2.0)
Bicarbonate: 22.1 mmol/L (ref 20.0–28.0)
O2 SAT: 70 %
PCO2 VEN: 33.1 mmHg — AB (ref 44.0–60.0)
TCO2: 23 mmol/L (ref 0–100)
pH, Ven: 7.432 — ABNORMAL HIGH (ref 7.250–7.430)
pO2, Ven: 35 mmHg (ref 32.0–45.0)

## 2016-07-06 LAB — BASIC METABOLIC PANEL
ANION GAP: 8 (ref 5–15)
BUN: 12 mg/dL (ref 6–20)
CALCIUM: 8.6 mg/dL — AB (ref 8.9–10.3)
CO2: 23 mmol/L (ref 22–32)
Chloride: 110 mmol/L (ref 101–111)
Creatinine, Ser: 0.98 mg/dL (ref 0.44–1.00)
GFR calc Af Amer: 59 mL/min — ABNORMAL LOW (ref >60)
GFR, EST NON AFRICAN AMERICAN: 51 mL/min — AB (ref >60)
Glucose, Bld: 78 mg/dL (ref 65–99)
POTASSIUM: 3.6 mmol/L (ref 3.5–5.1)
SODIUM: 141 mmol/L (ref 135–145)

## 2016-07-06 LAB — PROTIME-INR
INR: 1.01
PROTHROMBIN TIME: 13.3 s (ref 11.4–15.2)

## 2016-07-06 LAB — POCT ACTIVATED CLOTTING TIME: ACTIVATED CLOTTING TIME: 136 s

## 2016-07-06 SURGERY — RIGHT/LEFT HEART CATH AND CORONARY ANGIOGRAPHY

## 2016-07-06 MED ORDER — SODIUM CHLORIDE 0.9% FLUSH: 3.0000 mL | INTRAVENOUS | Status: DC | PRN

## 2016-07-06 MED ORDER — ISOSORBIDE MONONITRATE ER 30 MG PO TB24
30.0000 mg | ORAL_TABLET | Freq: Every day | ORAL | Status: DC
  Administered 2016-07-06 – 2016-07-07 (×2): 30 mg via ORAL
  Filled 2016-07-06 (×2): qty 1

## 2016-07-06 MED ORDER — HEPARIN (PORCINE) IN NACL 2-0.9 UNIT/ML-% IJ SOLN
INTRAMUSCULAR | Status: DC | PRN
  Administered 2016-07-06: 1500 mL

## 2016-07-06 MED ORDER — SODIUM CHLORIDE 0.9 % IV SOLN: 250.0000 mL | INTRAVENOUS | Status: DC | PRN

## 2016-07-06 MED ORDER — IOPAMIDOL (ISOVUE-370) INJECTION 76%
INTRAVENOUS | Status: AC
  Filled 2016-07-06: qty 100

## 2016-07-06 MED ORDER — SODIUM CHLORIDE 0.9% FLUSH
3.0000 mL | Freq: Two times a day (BID) | INTRAVENOUS | Status: DC
  Administered 2016-07-06 – 2016-07-07 (×2): 3 mL via INTRAVENOUS

## 2016-07-06 MED ORDER — HYDRALAZINE HCL 20 MG/ML IJ SOLN
INTRAMUSCULAR | Status: DC | PRN
  Administered 2016-07-06: 10 mg via INTRAVENOUS

## 2016-07-06 MED ORDER — ASPIRIN 81 MG PO CHEW: 81.0000 mg | CHEWABLE_TABLET | ORAL | Status: AC

## 2016-07-06 MED ORDER — SODIUM CHLORIDE 0.9 % WEIGHT BASED INFUSION
1.0000 mL/kg/h | INTRAVENOUS | Status: DC
  Administered 2016-07-06: 1 mL/kg/h via INTRAVENOUS

## 2016-07-06 MED ORDER — IOPAMIDOL (ISOVUE-370) INJECTION 76%
INTRAVENOUS | Status: DC | PRN
  Administered 2016-07-06: 65 mL

## 2016-07-06 MED ORDER — SODIUM CHLORIDE 0.9% FLUSH
3.0000 mL | INTRAVENOUS | Status: DC | PRN
  Filled 2016-07-06: qty 3

## 2016-07-06 MED ORDER — HYDRALAZINE HCL 20 MG/ML IJ SOLN
INTRAMUSCULAR | Status: AC
  Filled 2016-07-06: qty 1

## 2016-07-06 MED ORDER — SODIUM CHLORIDE 0.9 % WEIGHT BASED INFUSION
1.0000 mL/kg/h | INTRAVENOUS | Status: AC
  Administered 2016-07-06: 1 mL/kg/h via INTRAVENOUS

## 2016-07-06 MED ORDER — LIDOCAINE HCL (PF) 1 % IJ SOLN
INTRAMUSCULAR | Status: DC | PRN
  Administered 2016-07-06: 20 mL

## 2016-07-06 MED ORDER — LIDOCAINE HCL (PF) 1 % IJ SOLN
INTRAMUSCULAR | Status: AC
  Filled 2016-07-06: qty 30

## 2016-07-06 MED ORDER — HEPARIN (PORCINE) IN NACL 2-0.9 UNIT/ML-% IJ SOLN
INTRAMUSCULAR | Status: AC
  Filled 2016-07-06: qty 1000

## 2016-07-06 MED ORDER — MIRTAZAPINE 15 MG PO TBDP
7.5000 mg | ORAL_TABLET | Freq: Every day | ORAL | Status: DC
  Administered 2016-07-06: 7.5 mg via ORAL
  Filled 2016-07-06 (×2): qty 0.5

## 2016-07-06 MED ORDER — SODIUM CHLORIDE 0.9% FLUSH: 3.0000 mL | Freq: Two times a day (BID) | INTRAVENOUS | Status: DC

## 2016-07-06 MED ORDER — SODIUM CHLORIDE 0.9 % IV SOLN
INTRAVENOUS | Status: DC | PRN
  Administered 2016-07-06: 10 mL/h via INTRAVENOUS

## 2016-07-06 MED ORDER — SODIUM CHLORIDE 0.9 % WEIGHT BASED INFUSION
3.0000 mL/kg/h | INTRAVENOUS | Status: DC
  Administered 2016-07-06: 3 mL/kg/h via INTRAVENOUS

## 2016-07-06 MED ORDER — HEPARIN (PORCINE) IN NACL 100-0.45 UNIT/ML-% IJ SOLN
600.0000 [IU]/h | INTRAMUSCULAR | Status: DC
  Administered 2016-07-06: 600 [IU]/h via INTRAVENOUS

## 2016-07-06 SURGICAL SUPPLY — 10 items
CATH INFINITI 5FR MULTPACK ANG (CATHETERS) ×3 IMPLANT
CATH SWAN GANZ 7F STRAIGHT (CATHETERS) ×3 IMPLANT
KIT HEART LEFT (KITS) ×3 IMPLANT
PACK CARDIAC CATHETERIZATION (CUSTOM PROCEDURE TRAY) ×3 IMPLANT
SHEATH PINNACLE 5F 10CM (SHEATH) ×3 IMPLANT
SHEATH PINNACLE 7F 10CM (SHEATH) ×3 IMPLANT
TRANSDUCER W/STOPCOCK (MISCELLANEOUS) ×6 IMPLANT
TUBING CIL FLEX 10 FLL-RA (TUBING) ×3 IMPLANT
WIRE EMERALD 3MM-J .035X150CM (WIRE) ×3 IMPLANT
WIRE HI TORQ VERSACORE-J 145CM (WIRE) ×3 IMPLANT

## 2016-07-06 NOTE — H&P (View-Only) (Signed)
  Hospital Problem List     Principal Problem:   NSTEMI (non-ST elevated myocardial infarction) (HCC) Active Problems:   Syncope   Acute kidney injury (HCC)   Malnutrition of moderate degree   Essential hypertension, benign   Elevated troponin   Positive D dimer   Patient Profile:   82-year-old woman with history of CAD, CKD, HTN who presents after syncope.  Subjective   Doing well this morning. Sitting up at bedside eating breakfast. No complaints today. Denies any shortness of breath, chest pain. Reports walking with PT yesterday. Denies any issues.   Inpatient Medications    . aspirin EC  81 mg Oral Daily  . carvedilol  3.125 mg Oral BID WC  . citalopram  20 mg Oral Daily  . clopidogrel  300 mg Oral Once  . clopidogrel  75 mg Oral Daily  . feeding supplement (ENSURE ENLIVE)  237 mL Oral BID BM  . losartan  25 mg Oral QHS  . pravastatin  40 mg Oral q1800  . sodium chloride flush  3 mL Intravenous Q12H    Vital Signs    Vitals:   07/05/16 1335 07/05/16 1655 07/05/16 2122 07/06/16 0426  BP: (!) 119/58 112/69 (!) 154/73 (!) 121/57  Pulse: 88 98 84 100  Resp: 19  18 18  Temp: 97.7 F (36.5 C)  98.1 F (36.7 C) 98.7 F (37.1 C)  TempSrc: Oral  Oral Oral  SpO2:   100% 99%  Weight:       No intake or output data in the 24 hours ending 07/06/16 0817 Filed Weights   07/02/16 1409  Weight: 97 lb (44 kg)    Physical Exam    General: Cachetic and frail elderly female, sitting up at edge of bed.  Head: Normocephalic, atraumatic.      Neck: Supple without bruits, no JVD. Lungs:  Resp regular and unlabored, bibasilar crackles.  Heart: RRR, S1, S2, no S3, S4, or murmur; no rub. Abdomen: Soft, non-tender, non-distended with normoactive bowel sounds. Extremities: No clubbing, cyanosis, or edema. Distal pedal pulses are 2+ bilaterally. Neuro: Alert and oriented X 3. Moves all extremities spontaneously. Psych: Normal affect.  Labs    CBC  Recent Labs   07/05/16 0155 07/06/16 0314  WBC 11.8* 11.3*  HGB 10.3* 9.4*  HCT 31.3* 27.6*  MCV 88.7 89.0  PLT 252 251   Basic Metabolic Panel  Recent Labs  07/05/16 0155 07/06/16 0314  NA 142 141  K 3.6 3.6  CL 110 110  CO2 24 23  GLUCOSE 84 78  BUN 12 12  CREATININE 0.96 0.98  CALCIUM 8.7* 8.6*   D-Dimer  Recent Labs  07/03/16 1316  DDIMER 3.34*   Telemetry    NSR, V pacing, occasional PVCs  Assessment & Plan    Syncope  Interrogated her ICD yesterday. Reviewed with Dr. Turner. Showed an episode of 17 beat NS-VT on 11/4. She has not received any shocks. Do not suspect this syncopal episode is related to any arrhythmias. Most likely this was vasovagal given her episode occurring after having a BM.   Coronary artery disease/NSTEMI She has not had any CP or SOB. She had a peak troponin of 4.14 on admission which was down trending. Treating with medical therapy. Did have elevated D-dimer to 3. CTA and LE dopplers yesterday were both negative. Having more frequent episodes of NS-VT. Unclear from limited records when her last LHC was though does indicate that she has non-ischemic cardiomyopathy. Will   precede with R and LHC this afternoon.  -Continue aspirin, b-blockerand statin. -Continue heparin gtt -Heart cath this afternoon  Prior pacemaker or ICD Device interrogated. Working well. 4 episodes of NS-VT.   Chronic systolic HF Records from [facility] (on shadow chart) indicate that she has a history of non-ischemic cardiomyopathy with EF 20-25% with LBBB. Had biventricular ICD upgrade on 09/2015.  -EF 35% by echo here -Volume status looks Ok  -Continue carvedilol and low dose losartan  Adria Dill PGY2 IM Resident [phone]  8:17 AM 07/06/2016

## 2016-07-06 NOTE — Progress Notes (Signed)
  Date: 07/06/2016  Patient name: Lindsay Rose  Medical record number: [id number]  Date of birth: 1927-09-11   This patient's plan of care was discussed with the house staff. Please see their note for complete details. I concur with their findings. Without complaints. Has more energy.  Her CTA was negative.  pacer interogated.  For cath today.  Pt and family updated.  Appreciate CV f/u.   Ginnie Smart, MD 07/06/2016, 11:25 AM

## 2016-07-06 NOTE — Interval H&P Note (Signed)
History and Physical Interval Note:  07/06/2016 2:16 PM  Lindsay Rose  has presented today for surgery, with the diagnosis of cp  The various methods of treatment have been discussed with the patient and family. After consideration of risks, benefits and other options for treatment, the patient has consented to  Procedure(s): Right/Left Heart Cath and Coronary Angiography (N/A) as a surgical intervention .  The patient's history has been reviewed, patient examined, no change in status, stable for surgery.  I have reviewed the patient's chart and labs.  Questions were answered to the patient's satisfaction.    Cath Lab Visit (complete for each Cath Lab visit)  Clinical Evaluation Leading to the Procedure:   ACS: Yes.    Non-ACS:    Anginal Classification: No Symptoms  Anti-ischemic medical therapy: Minimal Therapy (1 class of medications)  Non-Invasive Test Results: No non-invasive testing performed  Prior CABG: No previous CABG       Theron Arista [facility] 07/06/2016 2:17 PM

## 2016-07-06 NOTE — Progress Notes (Addendum)
Subjective: Feels very sad again today, cannot or will not say why or what changed from yesterday.  Denies chest pain or dyspnea.    Cath yesterday with minimal oozing noted from R inguinal puncture site.    Objective:  Vital signs in last 24 hours: Vitals:   07/06/16 1933 07/06/16 2200 07/07/16 0500 07/07/16 1046  BP: (!) 144/73 132/74 133/63 130/64  Pulse: 77 79 76 81  Resp: 18  18   Temp: 97.6 F (36.4 C) 97.5 F (36.4 C) 97.8 F (36.6 C)   TempSrc: Oral Oral Oral   SpO2: 99% 100% 100% 99%  Weight:       Orthostatic VS for the past 24 hrs:  BP- Lying Pulse- Lying BP- Sitting Pulse- Sitting BP- Standing at 0 minutes Pulse- Standing at 0 minutes  07/07/16 1046 130/64 80 120/60 86 99/55 110    Physical Exam  Constitutional:  Cachectic, chronically ill-appearing woman, curled up in bed  Cardiovascular: Normal rate, regular rhythm and normal heart sounds.   Pulmonary/Chest:  Normal work of breathing, no respiratory distress, lungs CTAB Skin: Blood tinged pressure dressing at R inguinal cath site, no bleeding, tenderness, or hematoma Psychiatric:  Depressed, apathetic   CBC Latest Ref Rng & Units 07/07/2016 07/06/2016 07/05/2016  WBC 4.0 - 10.5 K/uL 8.9 11.3(H) 11.8(H)  Hemoglobin 12.0 - 15.0 g/dL 6.1(P) 5.0(D) 10.3(L)  Hematocrit 36.0 - 46.0 % 25.8(L) 27.6(L) 31.3(L)  Platelets 150 - 400 K/uL 268 251 252   BMP Latest Ref Rng & Units 07/07/2016 07/06/2016 07/05/2016  Glucose 65 - 99 mg/dL 67 78 84  BUN 6 - 20 mg/dL 10 12 12   Creatinine 0.44 - 1.00 mg/dL 3.26(Z) 1.24 5.80  Sodium 135 - 145 mmol/L 140 141 142  Potassium 3.5 - 5.1 mmol/L 3.5 3.6 3.6  Chloride 101 - 111 mmol/L 107 110 110  CO2 22 - 32 mmol/L 21(L) 23 24  Calcium 8.9 - 10.3 mg/dL 9.9(I) 3.3(A) 2.5(K)    Echocardiogram 07/02/2016 EF 30-35% Grade 1 diastolic dysfunction Increase PA pressure (67 mmHg peak) Normal RV size and function  Left and Right Corononary Angiography 07/06/2016  LV end  diastolic pressure is normal.  Mid RCA lesion, 70 %stenosed.  Ost LAD to Prox LAD lesion, 20 %stenosed.  Ost Cx to Prox Cx lesion, 30 %stenosed.  LV end diastolic pressure is normal.  Normal right heart pressures.  1. Single vessel borderline obstructive CAD 2. Normal LVEDP 3. Normal right heart pressures. 4. Normal cardiac output.   Plan: medical management.   Assessment/Plan:  Principal Problem:   NSTEMI (non-ST elevated myocardial infarction) (HCC) Active Problems:   Syncope   Acute kidney injury (HCC)   Malnutrition of moderate degree   Essential hypertension, benign   Elevated troponin   Positive D dimer   Syncope and collapse   Pulmonary HTN   Muscle weakness (generalized)   Goals of care, counseling/discussion   Palliative care encounter  #NSTEMI #Syncope Troponins peaked at 4.14 the night of admission and began falling, never reported symptoms of chest pain or dyspnea.   D-Dimer positive, CT ruled out with CT and negative DVT US.  Pacemaker interrogated and showed only NSVT, no shocks, no arrhythmia to explain her syncope.  Cardiac catheterization performed on 11/14 showed 70% stenosis of RCA, was not intervened upon.  She is orthostatic positive again today, and lungs are clear. -Dual antiplatelet therapy with aspirin and plavix -500 mL NS bolus  #HTN #CHF She was normotensive to mildly hypotensive  after admission and her home metoprolol, HCTZ, amlodipine, and terazosin were held.  Started on carvedilol 3.125 mg BID and losartan 25 mg daily per cardiology and continues to be normotensive. -Continue carvedilol 3.125 mg BID and losartan 25 mg daily -Add Imdur 30 mg daily -Add Spironolactone 12.5 mg daily  #Depression Endorses profound depression leading to apathy, malnourishment, and psychomotor retardation present at admission.  Reports successful treatment with antidepressants in the past, and is interested in resuming treatment. -Citalopram 20 mg  daily -Mirtazapine 7.5 mg QHS  #Acute on Chronic Renal Insufficiency Resolved.  Elevated creatinine, BUN/Cr, positive orthostatic vitals and clinically volume depleted on admission consistent with prerenal azotemia.  Endorses very poor PO intake due to depression and anorexia prior to admission.  Improved after 1.5L bolus fluid and now taking improved PO. -Follow BMP  #Concern for GI Bleed No GI Bleed. History on admission noted one possible maroon-colored stool on the day of admission, but she seems to not be a reliable historian.  No melenic stools or hematochezzia, FOBT negative, no clinical evidence of GI bleed.  Dispo: Anticipated discharge in approximately 1 day(s). [place] facility in [place]. Alm BustardMatthew O'Sullivan, MD 07/07/2016, 10:57 AM Pager: [phone]

## 2016-07-06 NOTE — Progress Notes (Signed)
ANTICOAGULATION CONSULT NOTE  Pharmacy Consult for Heparin  Indication: NSTEMI   Assessment: 82 y/o F on heparin for NSTEMI, r/o PE/DVT. Planning medical management at this time. Heparin level therapeutic (0.7) on 650 units/h. CBC has remained stable with no reported bleeding.   Goal of Therapy:  Heparin level 0.3-0.7 units/ml Monitor platelets by anticoagulation protocol: Yes   Plan:  -Decrease heparin just a little to keep in goal range. -Daily CBC/heparin level -Monitor for s/sx bleeding -Can we d/c heparin?  No Known Allergies  Patient Measurements: 44 kg (bed weight by RN)  Vital Signs: Temp: 98.7 F (37.1 C) (11/14 0426) Temp Source: Oral (11/14 0426) BP: 121/57 (11/14 0426) Pulse Rate: 100 (11/14 0426)  Labs:  Recent Labs  07/04/16 0118 07/05/16 0155 07/06/16 0314  HGB 10.3* 10.3* 9.4*  HCT 30.8* 31.3* 27.6*  PLT 251 252 251  HEPARINUNFRC 0.29* 0.57 0.70  CREATININE 0.96 0.96 0.98    CrCl cannot be calculated (Unknown ideal weight.).   Medical History: Past Medical History:  Diagnosis Date  . CKD (chronic kidney disease) 07/01/2016  . Essential hypertension 07/01/2016  . Glaucoma 07/01/2016  . Presence of stent in coronary artery in patient with coronary artery disease 07/01/2016   Tad Moore, BCPS  Clinical Pharmacist Pager [phone]  07/06/2016 8:39 AM

## 2016-07-06 NOTE — Progress Notes (Signed)
Site area: ARight groin a 5 french arterial and 7 french venous sheath was removed  Site Prior to Removal:  Level 0  Pressure Applied For 15 MINUTES    Bedrest Beginning at 1610p  Manual:   Yes.    Patient Status During Pull:  stable  Post Pull Groin Site:  Level 0  Post Pull Instructions Given:  Yes.    Post Pull Pulses Present:  Yes.    Dressing Applied:  Yes.    Comments:  VS remain stable during sheath pull

## 2016-07-06 NOTE — Progress Notes (Signed)
Hospital Problem List     Principal Problem:   NSTEMI (non-ST elevated myocardial infarction) (HCC) Active Problems:   Syncope   Acute kidney injury (HCC)   Malnutrition of moderate degree   Essential hypertension, benign   Elevated troponin   Positive D dimer   Patient Profile:   82 year old woman with history of CAD, CKD, HTN who presents after syncope.  Subjective   Doing well this morning. Sitting up at bedside eating breakfast. No complaints today. Denies any shortness of breath, chest pain. Reports walking with PT yesterday. Denies any issues.   Inpatient Medications    . aspirin EC  81 mg Oral Daily  . carvedilol  3.125 mg Oral BID WC  . citalopram  20 mg Oral Daily  . clopidogrel  300 mg Oral Once  . clopidogrel  75 mg Oral Daily  . feeding supplement (ENSURE ENLIVE)  237 mL Oral BID BM  . losartan  25 mg Oral QHS  . pravastatin  40 mg Oral q1800  . sodium chloride flush  3 mL Intravenous Q12H    Vital Signs    Vitals:   07/05/16 1335 07/05/16 1655 07/05/16 2122 07/06/16 0426  BP: (!) 119/58 112/69 (!) 154/73 (!) 121/57  Pulse: 88 98 84 100  Resp: 19  18 18   Temp: 97.7 F (36.5 C)  98.1 F (36.7 C) 98.7 F (37.1 C)  TempSrc: Oral  Oral Oral  SpO2:   100% 99%  Weight:       No intake or output data in the 24 hours ending 07/06/16 0817 Filed Weights   07/02/16 1409  Weight: 97 lb (44 kg)    Physical Exam    General: Cachetic and frail elderly female, sitting up at edge of bed.  Head: Normocephalic, atraumatic.      Neck: Supple without bruits, no JVD. Lungs:  Resp regular and unlabored, bibasilar crackles.  Heart: RRR, S1, S2, no S3, S4, or murmur; no rub. Abdomen: Soft, non-tender, non-distended with normoactive bowel sounds. Extremities: No clubbing, cyanosis, or edema. Distal pedal pulses are 2+ bilaterally. Neuro: Alert and oriented X 3. Moves all extremities spontaneously. Psych: Normal affect.  Labs    CBC  Recent Labs   07/05/16 0155 07/06/16 0314  WBC 11.8* 11.3*  HGB 10.3* 9.4*  HCT 31.3* 27.6*  MCV 88.7 89.0  PLT 252 251   Basic Metabolic Panel  Recent Labs  07/05/16 0155 07/06/16 0314  NA 142 141  K 3.6 3.6  CL 110 110  CO2 24 23  GLUCOSE 84 78  BUN 12 12  CREATININE 0.96 0.98  CALCIUM 8.7* 8.6*   D-Dimer  Recent Labs  07/03/16 1316  DDIMER 3.34*   Telemetry    NSR, V pacing, occasional PVCs  Assessment & Plan    Syncope  Interrogated her ICD yesterday. Reviewed with Dr. Mayford Knifeurner. Showed an episode of 17 beat NS-VT on 11/4. She has not received any shocks. Do not suspect this syncopal episode is related to any arrhythmias. Most likely this was vasovagal given her episode occurring after having a BM.   Coronary artery disease/NSTEMI She has not had any CP or SOB. She had a peak troponin of 4.14 on admission which was down trending. Treating with medical therapy. Did have elevated D-dimer to 3. CTA and LE dopplers yesterday were both negative. Having more frequent episodes of NS-VT. Unclear from limited records when her last LHC was though does indicate that she has non-ischemic cardiomyopathy. Will  precede with R and LHC this afternoon.  -Continue aspirin, b-blockerand statin. -Continue heparin gtt -Heart cath this afternoon  Prior pacemaker or ICD Device interrogated. Working well. 4 episodes of NS-VT.   Chronic systolic HF Records from [facility] (on shadow chart) indicate that she has a history of non-ischemic cardiomyopathy with EF 20-25% with LBBB. Had biventricular ICD upgrade on 09/2015.  -EF 35% by echo here -Volume status looks Ok  -Continue carvedilol and low dose losartan  Adria Dill PGY2 IM Resident [phone]  8:17 AM 07/06/2016

## 2016-07-06 NOTE — Progress Notes (Addendum)
Patient has developed bleeding at her femoral site from the cath lab. Pressure has been held. No further bleeding has been noted. No hematoma has been felt. Cath lab and on call PA have been notified.
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
## 2016-07-06 NOTE — Progress Notes (Addendum)
Cath showed single vessel borderline CAD with 70% mid RCA and otherwise 20-30% LCx and LAD disease and normal right heart pressures, LVEDP and  LVF.  Medical management recommended.  Continue ASA/BB/statin/Plavix.  Continue BB and ARB for LV dysfunction.  Add Imdur 15mg  daily for antianginal therapy.  Patient will need followup as outpt with Cardiology in 1-2 weeks.  Will sign off.  Call with any questions.

## 2016-07-06 NOTE — Care Management Important Message (Signed)
Important Message  Patient Details  Name: Lindsay Rose MRN: [id number] Date of Birth: 1927-12-11   Medicare Important Message Given:  Yes    Kyla Balzarine 07/06/2016, 8:43 AM

## 2016-07-06 NOTE — Progress Notes (Addendum)
Called by RN re: groin bleeding.  Pt had groin ooze after procedure. Staff held direct pressure and the bleeding was stopped. No hematoma noted.  Pt otherwise stable, no chest pain or SOB. Will continue bed rest until am. Cards to f/u in am, if groin remains stable, no further eval. Have sent staff message for f/u appt.  Leanna Battles 07/06/2016 7:35 PM Beeper [phone]

## 2016-07-06 NOTE — Progress Notes (Signed)
Subjective: Feeling much better with no chest pain or dyspnea.  Mood is much improved, and her niece says she is back to acting like her normal self.  Ate a bowl of soup for dinner and grits for breakfast.  Objective:  Vital signs in last 24 hours: Vitals:   07/05/16 1655 07/05/16 2122 07/06/16 0426 07/06/16 1018  BP: 112/69 (!) 154/73 (!) 121/57 (!) 142/79  Pulse: 98 84 100 89  Resp:  18 18   Temp:  98.1 F (36.7 C) 98.7 F (37.1 C)   TempSrc:  Oral Oral   SpO2:  100% 99%   Weight:       Physical Exam  Constitutional:  Cachectic, chronically ill-appearing woman sitting up comfortable in bed Cardiovascular: Normal rate, regular rhythm and normal heart sounds.   Pulmonary/Chest:  Normal work of breathing, no respiratory distress, lungs CTAB Psychiatric:  Mood euthymic, affect reactive    CBC Latest Ref Rng & Units 07/06/2016 07/05/2016 07/04/2016  WBC 4.0 - 10.5 K/uL 11.3(H) 11.8(H) 10.4  Hemoglobin 12.0 - 15.0 g/dL 6.9(G9.4(L) 10.3(L) 10.3(L)  Hematocrit 36.0 - 46.0 % 27.6(L) 31.3(L) 30.8(L)  Platelets 150 - 400 K/uL 251 252 251   BMP Latest Ref Rng & Units 07/06/2016 07/05/2016 07/04/2016  Glucose 65 - 99 mg/dL 78 84 295(M106(H)  BUN 6 - 20 mg/dL 12 12 15   Creatinine 0.44 - 1.00 mg/dL 8.410.98 3.240.96 4.010.96  Sodium 135 - 145 mmol/L 141 142 142  Potassium 3.5 - 5.1 mmol/L 3.6 3.6 3.7  Chloride 101 - 111 mmol/L 110 110 111  CO2 22 - 32 mmol/L 23 24 24   Calcium 8.9 - 10.3 mg/dL 0.2(V8.6(L) 2.5(D8.7(L) 6.6(Y8.7(L)    Echocardiogram 07/02/2016 EF 30-35% Grade 1 diastolic dysfunction Increase PA pressure (67 mmHg peak) Normal RV size and function   Assessment/Plan:  Principal Problem:   NSTEMI (non-ST elevated myocardial infarction) (HCC) Active Problems:   Syncope   Acute kidney injury (HCC)   Malnutrition of moderate degree   Essential hypertension, benign   Elevated troponin   Positive D dimer   Syncope and collapse   Pulmonary HTN  #NSTEMI #Syncope Troponins peaked at 4.14 the  night of admission and began falling, never reported symptoms of chest pain or dyspnea.   D-Dimer positive, CT ruled out with CT and negative DVT US.  Pacemaker interrogated and showed only NSVT, no shocks, no arrhythmia to explain her syncope.  Cardiology has decided to pursue cardiac catheterization. -Continue heparin anticoagulation pending DVT studies -Dual antiplatelet therapy with aspirin and plavix -Continue Telemetry -Interrogate pacemaker -F/u DVT US and CTA chest  #Depression Endorses profound depression leading to apathy, malnourishment, and psychomotor retardation present at admission.  Reports successful treatment with antidepressants in the past, and is interested in resuming treatment. -Citalopram 20 mg daily  #Acute on Chronic Renal Insufficiency Resolved.  Elevated creatinine, BUN/Cr, positive orthostatic vitals and clinically volume depleted on admission consistent with prerenal azotemia.  Endorses very poor PO intake due to depression and anorexia prior to admission.  Improved after 1.5L bolus fluid and now taking improved PO. -Follow BMP  #HTN She was normotensive to mildly hypotensive after admission and her home metoprolol, HCTZ, amlodipine, and terazosin were held.  Started on carvedilol 3.125 mg BID and losartan 25 mg daily per cardiology and continues to be normotensive. -Continue carvedilol 3.125 mg BID and losartan 25 mg daily  #Concern for GI Bleed No GI Bleed. History on admission noted one possible maroon-colored stool on the day of admission,  but she seems to not be a reliable historian.  No melenic stools or hematochezzia, FOBT negative, no clinical evidence of GI bleed.  Dispo: Anticipated discharge in approximately 1 day(s). Family wants rehab facility in [place].  Alm Bustard, MD 07/06/2016, 11:37 AM Pager: [phone]

## 2016-07-07 ENCOUNTER — Encounter (HOSPITAL_COMMUNITY): Payer: Self-pay | Admitting: Cardiology

## 2016-07-07 LAB — CBC
HCT: 25.8 % — ABNORMAL LOW (ref 36.0–46.0)
HEMOGLOBIN: 8.7 g/dL — AB (ref 12.0–15.0)
MCH: 30.1 pg (ref 26.0–34.0)
MCHC: 33.7 g/dL (ref 30.0–36.0)
MCV: 89.3 fL (ref 78.0–100.0)
Platelets: 268 10*3/uL (ref 150–400)
RBC: 2.89 MIL/uL — ABNORMAL LOW (ref 3.87–5.11)
RDW: 15.1 % (ref 11.5–15.5)
WBC: 8.9 10*3/uL (ref 4.0–10.5)

## 2016-07-07 LAB — BASIC METABOLIC PANEL
Anion gap: 12 (ref 5–15)
BUN: 10 mg/dL (ref 6–20)
CHLORIDE: 107 mmol/L (ref 101–111)
CO2: 21 mmol/L — ABNORMAL LOW (ref 22–32)
CREATININE: 1.03 mg/dL — AB (ref 0.44–1.00)
Calcium: 8.6 mg/dL — ABNORMAL LOW (ref 8.9–10.3)
GFR calc Af Amer: 55 mL/min — ABNORMAL LOW (ref >60)
GFR calc non Af Amer: 48 mL/min — ABNORMAL LOW (ref >60)
GLUCOSE: 67 mg/dL (ref 65–99)
POTASSIUM: 3.5 mmol/L (ref 3.5–5.1)
SODIUM: 140 mmol/L (ref 135–145)

## 2016-07-07 MED ORDER — ISOSORBIDE MONONITRATE ER 30 MG PO TB24: 15.0000 mg | ORAL_TABLET | Freq: Every day | ORAL | Status: DC

## 2016-07-07 MED ORDER — CARVEDILOL 3.125 MG PO TABS: 3.1250 mg | ORAL_TABLET | Freq: Two times a day (BID) | ORAL | 3 refills | Status: AC

## 2016-07-07 MED ORDER — CITALOPRAM HYDROBROMIDE 20 MG PO TABS: 20.0000 mg | ORAL_TABLET | Freq: Every day | ORAL | 2 refills | Status: AC

## 2016-07-07 MED ORDER — SODIUM CHLORIDE 0.9 % IV BOLUS (SEPSIS)
500.0000 mL | Freq: Once | INTRAVENOUS | Status: AC
  Administered 2016-07-07: 500 mL via INTRAVENOUS

## 2016-07-07 MED ORDER — SPIRONOLACTONE 25 MG PO TABS
12.5000 mg | ORAL_TABLET | Freq: Every day | ORAL | Status: DC
  Administered 2016-07-07: 12.5 mg via ORAL
  Filled 2016-07-07: qty 1

## 2016-07-07 MED ORDER — LOSARTAN POTASSIUM 25 MG PO TABS: 25.0000 mg | ORAL_TABLET | Freq: Every day | ORAL | 2 refills | Status: AC

## 2016-07-07 MED ORDER — ISOSORBIDE MONONITRATE ER 30 MG PO TB24: 15.0000 mg | ORAL_TABLET | Freq: Every day | ORAL | 2 refills | Status: AC

## 2016-07-07 MED ORDER — ENSURE ENLIVE PO LIQD: 237.0000 mL | Freq: Two times a day (BID) | ORAL | 12 refills | Status: AC

## 2016-07-07 MED ORDER — SPIRONOLACTONE 25 MG PO TABS: 12.5000 mg | ORAL_TABLET | Freq: Every day | ORAL | 2 refills | Status: AC

## 2016-07-07 MED ORDER — ASPIRIN 81 MG PO TBEC: 81.0000 mg | DELAYED_RELEASE_TABLET | Freq: Every day | ORAL | 3 refills | Status: AC

## 2016-07-07 MED ORDER — MIRTAZAPINE 15 MG PO TBDP: 7.5000 mg | ORAL_TABLET | Freq: Every day | ORAL | 2 refills | Status: AC

## 2016-07-07 NOTE — Clinical Social Work Note (Signed)
CSW talked with patient's daughter Randeep Colglazier on 11/14 and 11/15 regarding discharge plans for patient. CSW advised that patient will discharge home with her cousin and she Westley Hummer) will come and take her mother to facility in [place] ([facility]) on Friday. Daughter requested to fax [facility] disenrollment form to CSW for patient to sign and this was done on 11/15. Form was explained to patient, signed and faxed to [facility] Department, and daughter updated. Patient was discharged today and was picked up by family.  Genelle Bal, MSW, LCSW Licensed Clinical Social Worker Clinical Social Work Department [employer] [phone]

## 2016-07-07 NOTE — Progress Notes (Signed)
Palliative Care  I stopped by to visit with Lindsay Rose today. She reported feeling very tired and overwhelmed by all of the visitors she had this afternoon. She denied pain and reported that she had slept well overnight. She requested that I stop by tomorrow to talk with her, as she didn't have the energy today. I let her know that I would be off service, but that another Palliative Care provider would stop by.   No charge visit.   Murrell Converse AGNP-C Palliative Care
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
## 2016-07-07 NOTE — Progress Notes (Signed)
PT Cancellation Note  Patient Details Name: Lindsay Rose MRN: [id number] DOB: Jan 24, 1928   Cancelled Treatment:    Reason Eval/Treat Not Completed: Fatigue/lethargy limiting ability to participate;Patient declined, no reason specified   Spoke with RN who reports patient has been cleared for mobility now (groin bleed last night). Attempted to see patient later this morning, but pt declined. Encouraged bed level therapy and educated on importance of continued mobility. Pt continued to decline and maintain eyes closed stating she was "too weak." Notified RN of refusal. Continue PT POC.   Philip Aspen, PT, DPT Pager #: [phone] 07/07/2016, 11:08 AM

## 2016-07-07 NOTE — Progress Notes (Signed)
PT Cancellation Note  Patient Details Name: Lindsay Rose MRN: [id number] DOB: June 08, 1928   Pt remains on bedrest this AM until cleared by cards due to groin bleed last night. Will check back later and follow up as able later today if patient cleared for mobility.   Karolee Stamps Darrol Poke, PT, DPT Pager #: [phone]  07/07/2016, 8:15 AM

## 2016-07-07 NOTE — Progress Notes (Signed)
Hospital Problem List     Principal Problem:   NSTEMI (non-ST elevated myocardial infarction) (HCC) Active Problems:   Syncope   Acute kidney injury (HCC)   Malnutrition of moderate degree   Essential hypertension, benign   Elevated troponin   Positive D dimer   Syncope and collapse   Pulmonary HTN   Muscle weakness (generalized)   Goals of care, counseling/discussion   Palliative care encounter   Patient Profile:   82 year old woman with history of CAD, CKD, HTN who presents after syncope.  Subjective   Doing well this morning. Having some right groin tenderness. Otherwise no complaints.   Inpatient Medications    . aspirin EC  81 mg Oral Daily  . carvedilol  3.125 mg Oral BID WC  . citalopram  20 mg Oral Daily  . clopidogrel  75 mg Oral Daily  . feeding supplement (ENSURE ENLIVE)  237 mL Oral BID BM  . isosorbide mononitrate  30 mg Oral Daily  . losartan  25 mg Oral QHS  . mirtazapine  7.5 mg Oral QHS  . pravastatin  40 mg Oral q1800  . sodium chloride  500 mL Intravenous Once  . sodium chloride flush  3 mL Intravenous Q12H  . sodium chloride flush  3 mL Intravenous Q12H  . spironolactone  12.5 mg Oral Daily    Vital Signs    Vitals:   07/06/16 1933 07/06/16 2200 07/07/16 0500 07/07/16 1046  BP: (!) 144/73 132/74 133/63 130/64  Pulse: 77 79 76 81  Resp: 18  18   Temp: 97.6 F (36.4 C) 97.5 F (36.4 C) 97.8 F (36.6 C)   TempSrc: Oral Oral Oral   SpO2: 99% 100% 100% 99%  Weight:        Intake/Output Summary (Last 24 hours) at 07/07/16 1130 Last data filed at 07/06/16 2200  Gross per 24 hour  Intake              120 ml  Output              400 ml  Net             -280 ml   Filed Weights   07/02/16 1409  Weight: 97 lb (44 kg)    Physical Exam    General: Cachetic and frail elderly female, sitting up at edge of bed.  Head: Normocephalic, atraumatic.      Neck: Supple without bruits, no JVD. Lungs:  Resp regular and unlabored, CTAB.    Heart: RRR, S1, S2, no S3, S4, or murmur; no rub. Abdomen: Soft, non-tender, non-distended with normoactive bowel sounds. Right groin with dressing in place, no active bleeding, no hematoma. Extremities: No clubbing, cyanosis, or edema. Unable to palpate distal pulses.  Neuro: Alert and oriented X 3. Moves all extremities spontaneously. Psych: Normal affect.  Labs    CBC  Recent Labs  07/06/16 0314 07/07/16 0737  WBC 11.3* 8.9  HGB 9.4* 8.7*  HCT 27.6* 25.8*  MCV 89.0 89.3  PLT 251 268   Basic Metabolic Panel  Recent Labs  07/06/16 0314 07/07/16 0737  NA 141 140  K 3.6 3.5  CL 110 107  CO2 23 21*  GLUCOSE 78 67  BUN 12 10  CREATININE 0.98 1.03*  CALCIUM 8.6* 8.6*   D-Dimer No results for input(s): DDIMER in the last 72 hours. Telemetry    NSR, V pacing, occasional PVCs  Assessment & Plan    Syncope  Interrogated her  ICD yesterday. Reviewed with Dr. Mayford Knifeurner. Showed an episode of 17 beat NS-VT on 11/4. She has not received any shocks. Do not suspect this syncopal episode is related to any arrhythmias. Most likely this was vasovagal given her episode occurring after having a BM.   Coronary artery disease/NSTEMI Cath yesterday with  single vessel borderline CAD with 70% mid RCA and otherwise 20-30% LCx and LAD disease and normal right heart pressures, LVEDP and  LVF.  Medical management recommended.  -Continue aspirin, plavix b-blockerand statin. -Add imdur 15 mg daily for antianginal therapy -follow up with cardiology in 1-2 weeks.   Right Groin Pain/Bleeding Had bleeding and pain in her right groin at cath site overnight. No evidence of hematoma. Bleeding appears to have stop. CBC stable this morning. Still complains of some soreness in the area but improved.  Difficult to palpate bilateral pedal pulses. Will doppler to check for pulses.   Prior pacemaker or ICD Device interrogated. Working well. 4 episodes of NS-VT.   Chronic systolic HF [facility] (on shadow [facility]) indicate that she has a history of non-ischemic cardiomyopathy with EF 20-25% with LBBB. Had biventricular ICD upgrade on 09/2015.  -EF 35% by echo here -Volume status looks Ok  -Continue carvedilol and low dose losartan  Adria DillSigned, Delphin Funes PGY2 IM Resident [phone]  11:30 AM 07/07/2016

## 2016-07-07 NOTE — Progress Notes (Addendum)
   Patient unavailable during multiple visits (14:30, 15:00, 15:45)  Sleeping.  Referred to unit chaplain.    - Rev. Chaplain Kipp Brood MDiv ThM

## 2016-07-07 NOTE — Discharge Instructions (Addendum)
You were admitted to the hospital because you passed out, and we found that you were having a heart attack.  We treated you for the heart attack and adjusted your heart medications.  You have also been suffering from severe depression which has contributed to you becoming weak and malnourished.  I have prescribed you 2 new medicine for depression, citalopram and mitrazapine.  It is important to take them every day regardless of how you are feeling.  Please review your medication list and take everything as prescribed.  It is important for you to follow-up with a primary doctor next week and a cardiologist within a month.  We have made appointments for you in [place].  If you go to [facility], please establish care with a doctor ASAP.  If you pass out again, or have chest pain or shortness of breath, please call 911 or go the the ER.

## 2016-07-12 ENCOUNTER — Telehealth: Payer: Self-pay | Admitting: General Practice

## 2016-07-13 ENCOUNTER — Ambulatory Visit: Payer: Medicare PPO

## 2016-07-13 ENCOUNTER — Encounter: Payer: Self-pay | Admitting: Internal Medicine

## 2016-07-22 ENCOUNTER — Ambulatory Visit: Payer: Medicare PPO | Admitting: Cardiology

## 2016-07-26 ENCOUNTER — Encounter: Payer: Self-pay | Admitting: Cardiology

## 2017-05-16 IMAGING — CT CT ANGIO CHEST
3 of 7 series · 18 of 36 positions shown · IV contrast (Omni 300)
Comparison: Chest x-ray 07/02/2016

CLINICAL DATA: Evaluate for pulmonary embolus, positive D-dimer. No
shortness of breath.

EXAM:
CT ANGIOGRAPHY CHEST WITH CONTRAST
TECHNIQUE: Multidetector CT imaging of the chest was performed using the
standard protocol during bolus administration of intravenous
contrast. Multiplanar CT image reconstructions and MIPs were
obtained to evaluate the vascular anatomy.
CONTRAST:  100 cc Isovue 370

[Series 5: pe thins · axial · 0.62mm/px · z∈[+1127,+1379]mm · 13 of 294 slices shown]
[im 21/294  lung]
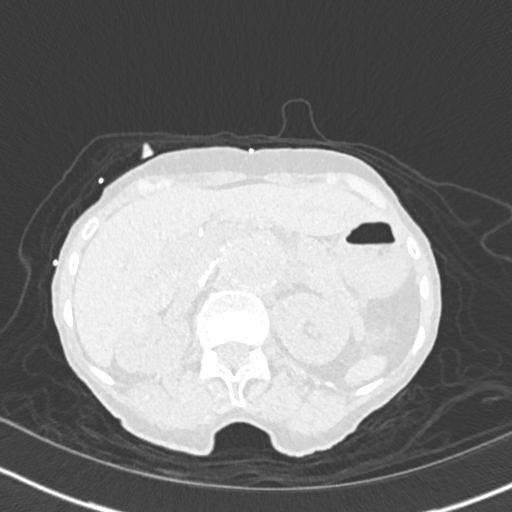
[im 42/294  mediastinal]
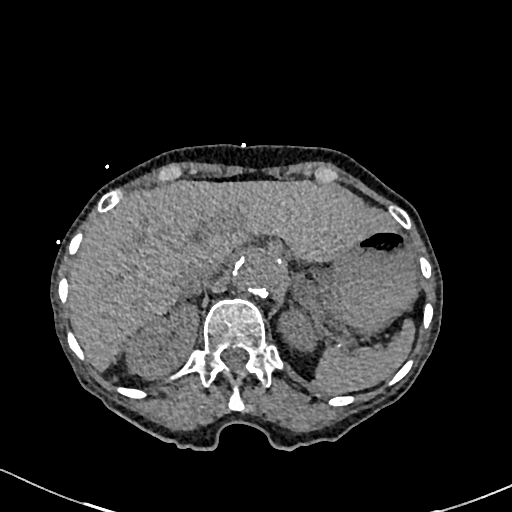
[im 63/294  lung]
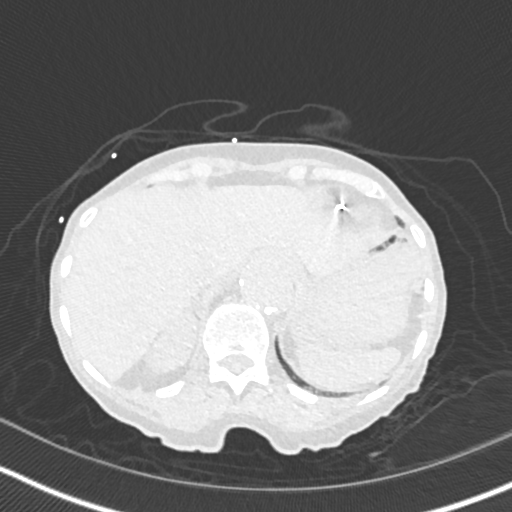
[im 84/294  mediastinal]
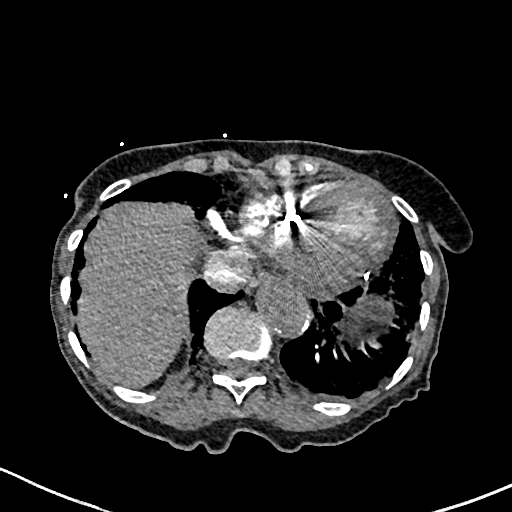
[im 105/294  lung]
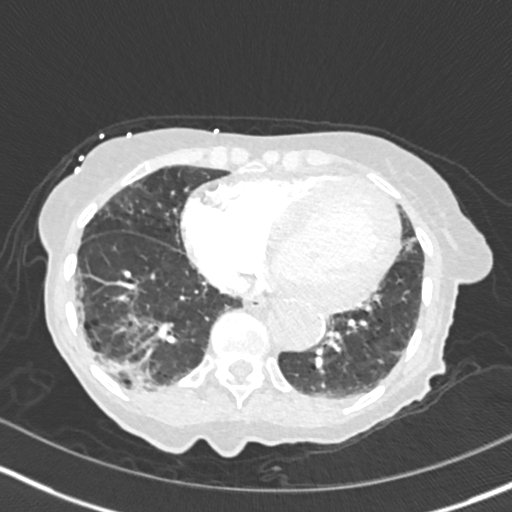
[im 126/294  mediastinal]
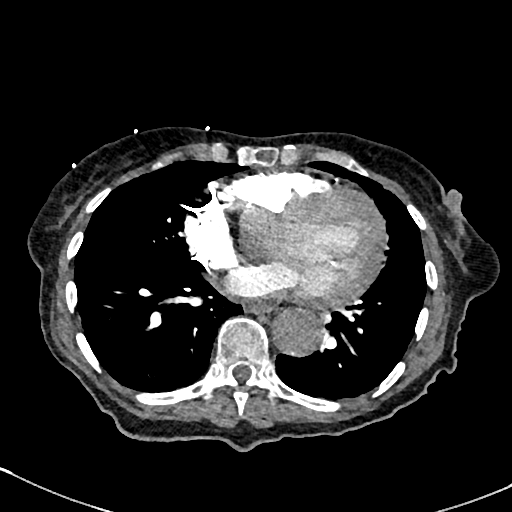
[im 147/294  lung]
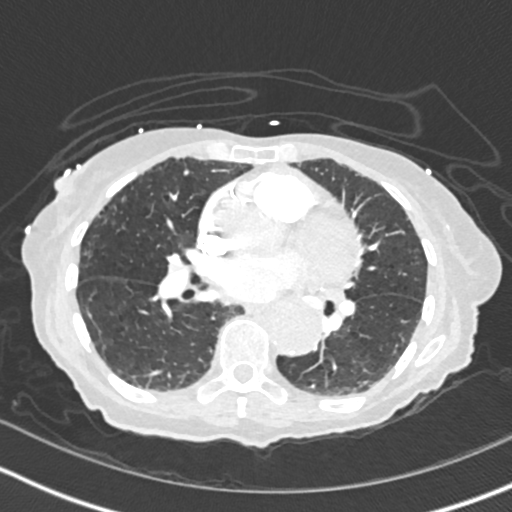
[im 168/294  mediastinal]
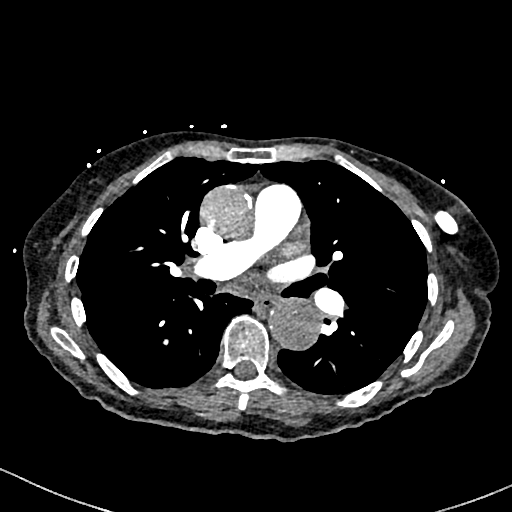
[im 189/294  lung]
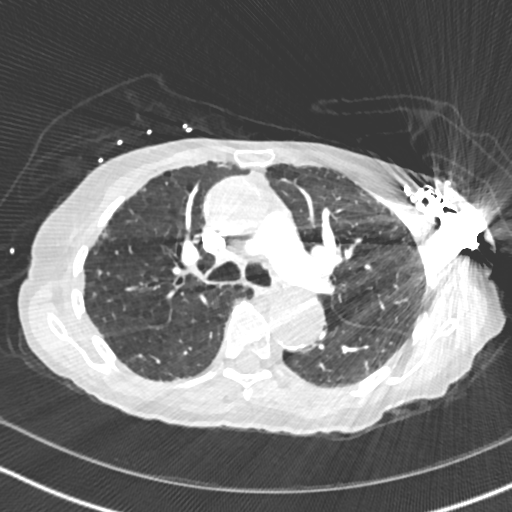
[im 210/294  mediastinal]
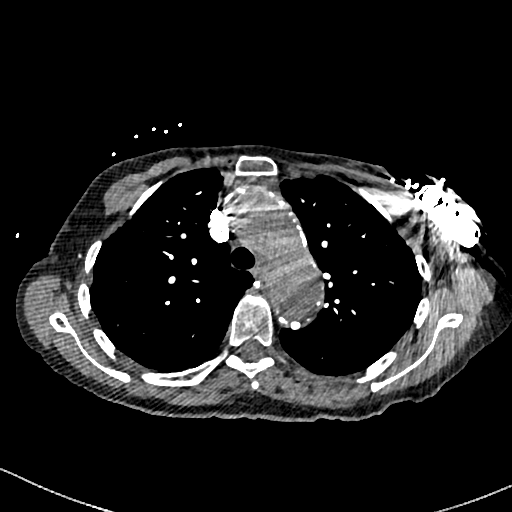
[im 231/294  lung]
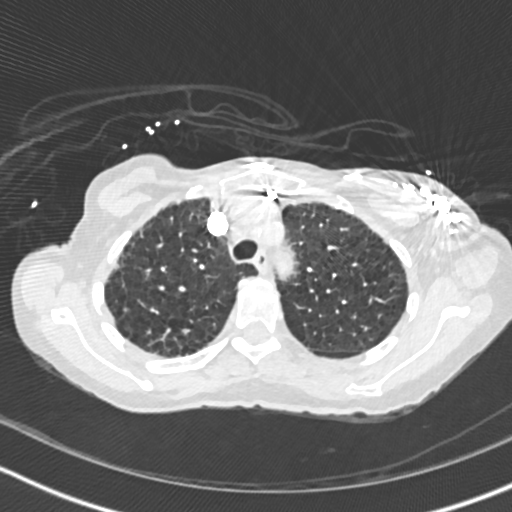
[im 252/294  mediastinal]
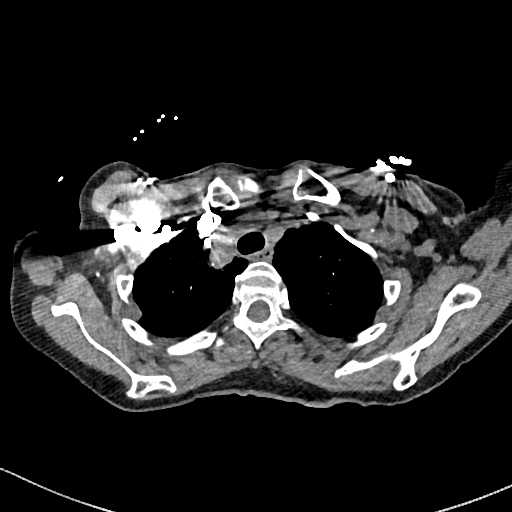
[im 273/294  lung]
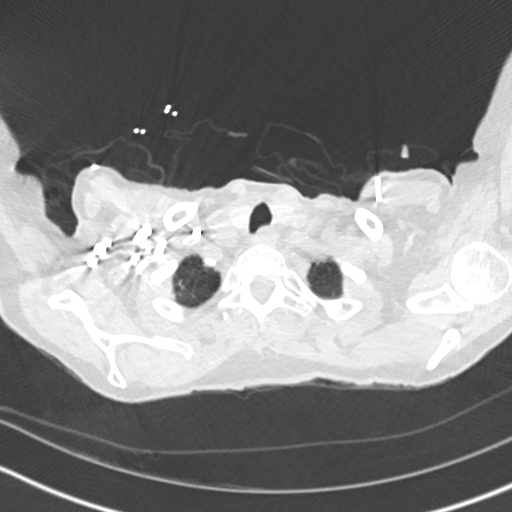

[Series 6: pe 2mm cor · coronal · 0.59mm/px · 1 of 108 slices shown]
[im 54/108  mediastinal]
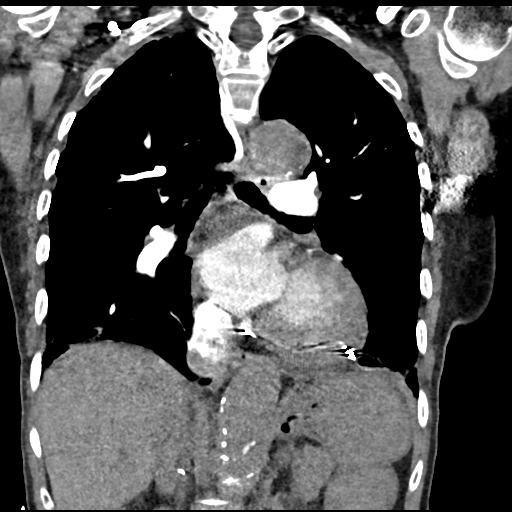

[Series 10: pe lung · axial · 0.62mm/px · z∈[+1204,+1350]mm · 4 of 123 slices shown]
[im 25/123  mediastinal]
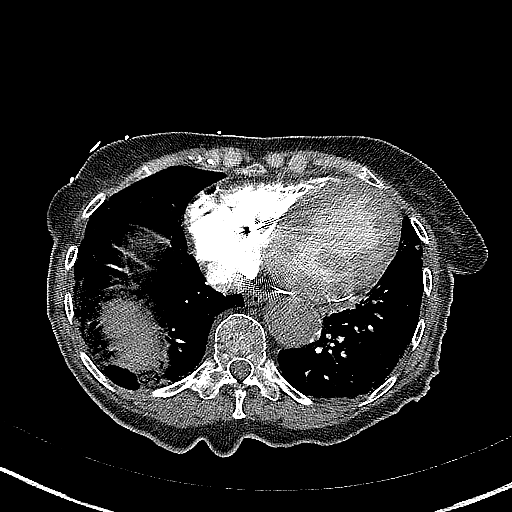
[im 49/123  mediastinal]
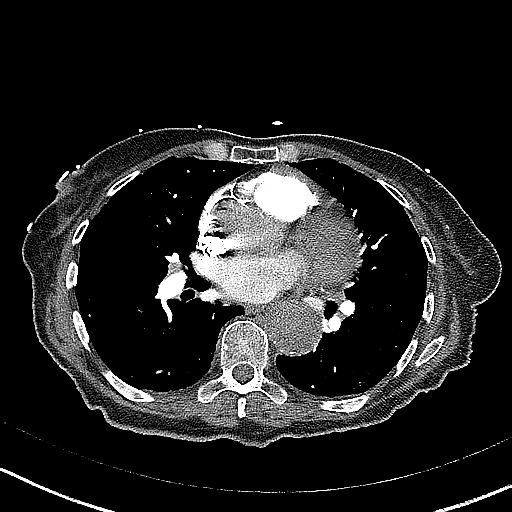
[im 74/123  mediastinal]
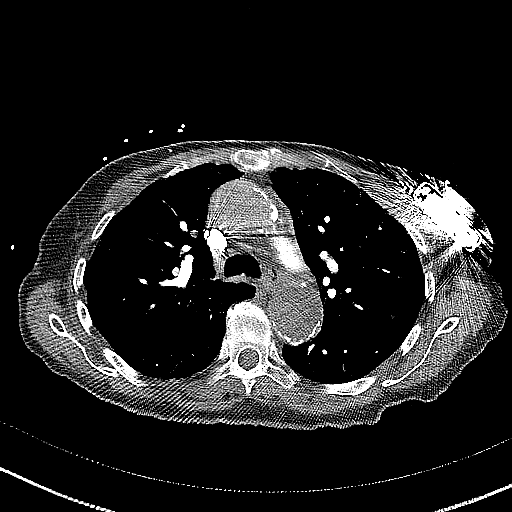
[im 98/123  mediastinal]
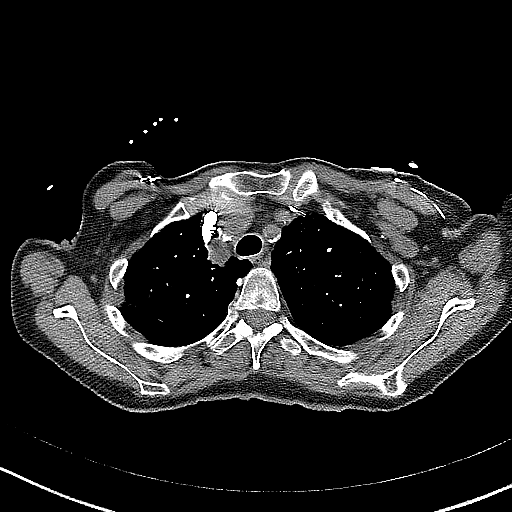

[18 of 36 positions shown; findings below may reference images not displayed]

FINDINGS: Cardiovascular: Patient has left-sided transvenous pacemaker with
leads to the right atrium, right ventricle, and coronary sinus.
There is coronary artery disease. No pericardial effusion. The heart
is mildly enlarged. Pulmonary arteries are well opacified. There is
no acute pulmonary embolus. The thoracic aorta is densely calcified
but not aneurysmal, measuring 3.5 cm maximum diameter in the
ascending arch.

Mediastinum/Nodes: The visualized portion of the thyroid gland has a
normal appearance. No mediastinal, hilar, or axillary adenopathy.

Lungs/Pleura: Emphysematous changes are identified throughout the
lungs. There is a subpleural nodule measuring 4 x 3 mm in the right
upper lobe on image 47 of series 10. There are no focal
consolidations. No pleural effusions.

Upper Abdomen: Suspect calcified gallstone. There is atherosclerotic
calcification of the abdominal aorta. Aorta measures 3.3 x 3.1 cm in
the suprarenal portion . Colonic diverticula are present.

Musculoskeletal: Degenerative changes are seen in thoracic spine.

Review of the MIP images confirms the above findings.
IMPRESSION: 1. Technically adequate exam showing no acute pulmonary embolus.
2. Cardiomegaly and coronary artery disease.  Transvenous pacemaker.
3. Emphysematous changes.
4. Right upper lobe nodule. No follow-up needed if patient is
low-risk. Non-contrast chest CT can be considered in 12 months if
patient is high-risk. This recommendation follows the consensus
statement: Guidelines for Management of Incidental Pulmonary Nodules
Detected on CT Images: From the [HOSPITAL] 7328; Radiology
7328; [DATE].
5. Abdominal aortic aneurysm, 3.3 cm maximum diameter. Recommend
followup by US in 3 years. This recommendation follows ACR consensus
guidelines: White Paper of the ACR Incidental Findings Committee II
6. Colonic diverticulosis.

## 2017-08-23 DEATH — deceased
